# Patient Record
Sex: Female | Born: 1955 | Race: White | Hispanic: No | Marital: Married | Smoking: Former smoker
Health system: Southern US, Community
[De-identification: ages and names within clinical notes are randomized; demographics above are authoritative.]

## PROBLEM LIST (undated history)

## (undated) DIAGNOSIS — R011 Cardiac murmur, unspecified: Secondary | ICD-10-CM

## (undated) DIAGNOSIS — K219 Gastro-esophageal reflux disease without esophagitis: Secondary | ICD-10-CM

## (undated) HISTORY — PX: REDUCTION MAMMAPLASTY: SUR839

## (undated) HISTORY — DX: Gastro-esophageal reflux disease without esophagitis: K21.9

## (undated) HISTORY — DX: Cardiac murmur, unspecified: R01.1

## (undated) HISTORY — PX: ANKLE SURGERY: SHX546

## (undated) HISTORY — PX: SINUS EXPLORATION: SHX5214

---

## 1998-03-27 HISTORY — PX: MINOR HEMORRHOIDECTOMY: SHX6238

## 1998-04-23 ENCOUNTER — Other Ambulatory Visit: Admission: RE | Admit: 1998-04-23 | Discharge: 1998-04-23 | Payer: Self-pay | Admitting: General Surgery

## 1999-04-18 ENCOUNTER — Encounter: Admission: RE | Admit: 1999-04-18 | Discharge: 1999-04-18 | Payer: Self-pay | Admitting: Gynecology

## 1999-04-18 ENCOUNTER — Encounter: Payer: Self-pay | Admitting: Gynecology

## 2000-03-12 ENCOUNTER — Other Ambulatory Visit: Admission: RE | Admit: 2000-03-12 | Discharge: 2000-03-12 | Payer: Self-pay | Admitting: Gynecology

## 2000-05-01 ENCOUNTER — Encounter: Payer: Self-pay | Admitting: Gynecology

## 2000-05-01 ENCOUNTER — Encounter: Admission: RE | Admit: 2000-05-01 | Discharge: 2000-05-01 | Payer: Self-pay | Admitting: Gynecology

## 2001-03-08 ENCOUNTER — Ambulatory Visit (HOSPITAL_BASED_OUTPATIENT_CLINIC_OR_DEPARTMENT_OTHER): Admission: RE | Admit: 2001-03-08 | Discharge: 2001-03-09 | Payer: Self-pay | Admitting: Plastic Surgery

## 2001-03-08 ENCOUNTER — Encounter (INDEPENDENT_AMBULATORY_CARE_PROVIDER_SITE_OTHER): Payer: Self-pay | Admitting: *Deleted

## 2002-04-18 ENCOUNTER — Encounter: Admission: RE | Admit: 2002-04-18 | Discharge: 2002-04-18 | Payer: Self-pay | Admitting: Gynecology

## 2002-04-18 ENCOUNTER — Encounter: Payer: Self-pay | Admitting: Gynecology

## 2002-05-05 ENCOUNTER — Other Ambulatory Visit: Admission: RE | Admit: 2002-05-05 | Discharge: 2002-05-05 | Payer: Self-pay | Admitting: Gynecology

## 2003-04-24 ENCOUNTER — Encounter: Admission: RE | Admit: 2003-04-24 | Discharge: 2003-04-24 | Payer: Self-pay | Admitting: Gynecology

## 2003-05-11 ENCOUNTER — Other Ambulatory Visit: Admission: RE | Admit: 2003-05-11 | Discharge: 2003-05-11 | Payer: Self-pay | Admitting: Gynecology

## 2004-04-25 ENCOUNTER — Encounter: Admission: RE | Admit: 2004-04-25 | Discharge: 2004-04-25 | Payer: Self-pay | Admitting: Gynecology

## 2004-05-23 ENCOUNTER — Other Ambulatory Visit: Admission: RE | Admit: 2004-05-23 | Discharge: 2004-05-23 | Payer: Self-pay | Admitting: Gynecology

## 2005-05-18 ENCOUNTER — Encounter: Admission: RE | Admit: 2005-05-18 | Discharge: 2005-05-18 | Payer: Self-pay | Admitting: Gynecology

## 2005-06-19 ENCOUNTER — Other Ambulatory Visit: Admission: RE | Admit: 2005-06-19 | Discharge: 2005-06-19 | Payer: Self-pay | Admitting: Gynecology

## 2006-05-22 ENCOUNTER — Encounter: Admission: RE | Admit: 2006-05-22 | Discharge: 2006-05-22 | Payer: Self-pay | Admitting: Gynecology

## 2006-06-07 ENCOUNTER — Encounter: Admission: RE | Admit: 2006-06-07 | Discharge: 2006-06-07 | Payer: Self-pay | Admitting: Gynecology

## 2006-07-02 ENCOUNTER — Other Ambulatory Visit: Admission: RE | Admit: 2006-07-02 | Discharge: 2006-07-02 | Payer: Self-pay | Admitting: Gynecology

## 2007-06-20 ENCOUNTER — Encounter: Admission: RE | Admit: 2007-06-20 | Discharge: 2007-06-20 | Payer: Self-pay | Admitting: Gynecology

## 2008-07-01 ENCOUNTER — Encounter: Admission: RE | Admit: 2008-07-01 | Discharge: 2008-07-01 | Payer: Self-pay | Admitting: Gynecology

## 2009-07-06 ENCOUNTER — Encounter: Admission: RE | Admit: 2009-07-06 | Discharge: 2009-07-06 | Payer: Self-pay | Admitting: Gynecology

## 2010-03-27 HISTORY — PX: BREAST REDUCTION SURGERY: SHX8

## 2010-06-01 ENCOUNTER — Other Ambulatory Visit: Payer: Self-pay | Admitting: Gynecology

## 2010-06-01 DIAGNOSIS — Z1231 Encounter for screening mammogram for malignant neoplasm of breast: Secondary | ICD-10-CM

## 2010-07-19 ENCOUNTER — Ambulatory Visit
Admission: RE | Admit: 2010-07-19 | Discharge: 2010-07-19 | Disposition: A | Discharge status: Home / Self Care | Payer: PRIVATE HEALTH INSURANCE | Source: Ambulatory Visit | Attending: Gynecology | Admitting: Gynecology

## 2010-07-19 DIAGNOSIS — Z1231 Encounter for screening mammogram for malignant neoplasm of breast: Secondary | ICD-10-CM

## 2010-08-12 NOTE — Op Note (Signed)
[facility]  Patient:    Tracy Cline, RULAND Visit Number: [id number] MRN: [id number]          Service Type: DSU Location: [facility] Attending Physician:  Eloise Levels Dictated by:   Mary A. Contogiannis, M.D. Proc. Date: 03/08/01 Admit Date:  03/08/2001   CC:         Gretta Cool, M.D.   Operative Report  PREOPERATIVE DIAGNOSIS:  Bilateral macromastia.  POSTOPERATIVE DIAGNOSIS:  Bilateral macromastia.  PROCEDURE:  Bilateral reduction mammoplasties.  SURGEON:  Mary A. Contogiannis, M.D.  ASSISTANT:  Stanford Scotland, C.F.A.  ANESTHESIA:  General endotracheal anesthesia.  ANESTHESIOLOGIST:  Maren Beach, M.D.  ESTIMATED BLOOD LOSS:  200 cc.  FLUID REPLACEMENT:  3600 cc crystalloid.  URINE OUTPUT:  300 cc.  COMPLICATIONS:  None.  BREAST TISSUE REMOVED:  Right breast 502 grams, left breast 564 grams.  JUSTIFICATION FOR OVERNIGHT STAY:  Progressive pain control along with ambulation and monitoring of the nipples and breast flaps.  INDICATIONS:  The patient is a 55 year old Caucasian female who was referred by Gretta Cool, M.D. for evaluation of her large breasts.  She has upper backaches, neckaches, headaches, shoulder marks, and pain.  She now presents to undergo bilateral reduction mammoplasties.  DESCRIPTION OF PROCEDURE:  The patient was marked in the preoperative holding area in a pattern of Wise.  She was then taken back to the OR, placed on the table in the supine position.  After adequate general endotracheal anesthesia was obtained, the patients chest was prepped with Betadine and alcohol and draped in a sterile fashion.  The base of the breasts were then injected with 1% lidocaine with epinephrine.  After adequate hemostasis and anesthesia had taken effect, the procedure was begun.  First the right breast reduction was performed.  The nipple was marked with the 42 mm nipple marker.  The skin was then  deepithelialized around this down to the inframammary crease in the inferior pedicle pattern.  Next, the medial, superior, and lateral skin flaps were then elevated down to the chest wall and slightly off to allow shaping of the breasts.  The excess fat and glandular tissue were then removed from the inferior pedicle.  The nipple was then examined and found to be pink and viable.  Meticulous hemostasis was obtained. The wound was irrigated with saline.  The inferior pedicle was then centralized using 3-0 Prolene suture.  A #10 JP flat fully-fluted drain was then placed into the wound.  The skin flaps were brought together at the inverted T junction with a 2-0 Prolene suture.  The skin incisions were stapled for temporary closure.  Attention was then turned to the left breast.  The nipple was marked to the 42 mm nipple marker.  This was then incised and the skin around it deepithelialized down to the inferior mammary crease in the inferior pedicle pattern.  Next, the medial, superior, and lateral skin flaps were elevated down to the chest wall and slightly off for shaping of the breasts.  The excess fat and glandular tissue were then removed from the inferior pedicle. The nipple then examined and found to be pink and viable after this dissection. The wound was then irrigated with saline irrigation.  Meticulous hemostasis was obtained with the Bovie electrocautery.  The inferior pedicle was centralized using 3-0 Prolene suture.  A #10 JP flat fully-fluted drain was then placed into the wound.  The skin flaps were brought together in the inverted T junction  with a 2-0 Prolene suture.  The skin incisions were then stapled for temporary closure.  The breasts were then compared and found to have good shape and symmetry.  The staples were removed from each breast and the incisions closed using 3-0 Monocryl interrupted dermal sutures followed by a 4-0 Monocryl running intracuticular stitch on the  skin.  The patient was then placed in the upright position.  The location of the nipples were marked on each breast with a 38 mm nipple marker.  She was then placed back in a recumbant position.  The area for the future nipple areolar complex was then incised on the right breast.  This tissue was removed full thickness with the Bovie electrocautery. The nipple was then brought out into this aperture and sewn in place using 4-0 Monocryl interrupted dermal sutures followed by 5-0 Monocryl running intracuticular stitch on the skin.  The nipple was pink and viable.  Attention was then turned to the left breast.  The area for the future nipple areolar complex on the left breast was incised and all of the tissue excised full thickness with the Bovie electrocautery.  The nipple was then brought out into this aperture and sewn in place using 4-0 Monocryl interrupted dermal sutures followed by a 5-0 Monocryl running intracuticular stitch on the skin.  The drains were then secured in place using 3-0 nylon suture.  The incisions were dressed with Benzoin and Steri-Strips and the nipples additionally with Bacitracin ointment and Adaptic.  4 x 4s were placed over this and the patient was placed into light support bra.  There were no complications.  The patient tolerated the procedure well.  She was then extubated and taken to the recovery room in stable condition.  The final sponge, needle, and instrument counts were correct at the end of the case.  She will remain overnight in the [facility] for progressive pain control along with ambulation and monitoring of the nipples and breast flaps.  Discharge is planned for the morning. Dictated by:   Mary A. Contogiannis, M.D. Attending Physician:  Eloise Levels DD:  03/08/01 TD:  03/08/01 Job: [id number]

## 2011-06-29 ENCOUNTER — Other Ambulatory Visit: Payer: Self-pay | Admitting: Gynecology

## 2011-06-29 DIAGNOSIS — Z1231 Encounter for screening mammogram for malignant neoplasm of breast: Secondary | ICD-10-CM

## 2011-07-20 ENCOUNTER — Ambulatory Visit
Admission: RE | Admit: 2011-07-20 | Discharge: 2011-07-20 | Disposition: A | Discharge status: Home / Self Care | Payer: PRIVATE HEALTH INSURANCE | Source: Ambulatory Visit | Attending: Gynecology | Admitting: Gynecology

## 2011-07-20 DIAGNOSIS — Z1231 Encounter for screening mammogram for malignant neoplasm of breast: Secondary | ICD-10-CM

## 2012-07-25 ENCOUNTER — Other Ambulatory Visit: Payer: Self-pay

## 2012-07-25 DIAGNOSIS — Z1231 Encounter for screening mammogram for malignant neoplasm of breast: Secondary | ICD-10-CM

## 2012-09-02 ENCOUNTER — Ambulatory Visit
Admission: RE | Admit: 2012-09-02 | Discharge: 2012-09-02 | Disposition: A | Discharge status: Home / Self Care | Payer: PRIVATE HEALTH INSURANCE | Source: Ambulatory Visit

## 2012-09-02 DIAGNOSIS — Z1231 Encounter for screening mammogram for malignant neoplasm of breast: Secondary | ICD-10-CM

## 2012-09-23 ENCOUNTER — Other Ambulatory Visit: Payer: Self-pay | Admitting: Gynecology

## 2012-09-23 DIAGNOSIS — E2839 Other primary ovarian failure: Secondary | ICD-10-CM

## 2012-12-23 ENCOUNTER — Ambulatory Visit
Admission: RE | Admit: 2012-12-23 | Discharge: 2012-12-23 | Disposition: A | Discharge status: Home / Self Care | Payer: PRIVATE HEALTH INSURANCE | Source: Ambulatory Visit | Attending: Gynecology | Admitting: Gynecology

## 2012-12-23 DIAGNOSIS — E2839 Other primary ovarian failure: Secondary | ICD-10-CM

## 2013-04-25 ENCOUNTER — Ambulatory Visit (INDEPENDENT_AMBULATORY_CARE_PROVIDER_SITE_OTHER): Payer: PRIVATE HEALTH INSURANCE | Admitting: Surgery

## 2013-04-25 ENCOUNTER — Encounter (INDEPENDENT_AMBULATORY_CARE_PROVIDER_SITE_OTHER): Payer: Self-pay | Admitting: Surgery

## 2013-04-25 VITALS — BP 128/81 | HR 72 | Temp 98.1°F | Resp 18 | Ht 67.0 in | Wt 159.4 lb

## 2013-04-25 DIAGNOSIS — K648 Other hemorrhoids: Secondary | ICD-10-CM

## 2013-04-25 DIAGNOSIS — K644 Residual hemorrhoidal skin tags: Secondary | ICD-10-CM

## 2013-04-25 MED ORDER — OXYCODONE HCL 5 MG PO TABS: 5.0000 mg | ORAL_TABLET | Freq: Four times a day (QID) | ORAL | Status: DC | PRN

## 2013-04-25 MED ORDER — NAPROXEN 500 MG PO TABS
500.0000 mg | ORAL_TABLET | Freq: Two times a day (BID) | ORAL | Status: DC
Start: 2013-04-25 — End: 2013-04-25

## 2013-04-25 MED ORDER — NAPROXEN 500 MG PO TABS: 500.0000 mg | ORAL_TABLET | Freq: Two times a day (BID) | ORAL | Status: DC

## 2013-04-25 NOTE — Progress Notes (Signed)
Subjective:     Patient ID: Tracy Cline, female   DOB: [birth year], 58 y.o.   MRN: [id number]  HPI  Note: This dictation was prepared with Dragon/digital dictation along with [facility] technology. Any transcriptional errors that result from this process are unintentional.       Tracy Cline  December 16, 1955 [id number]  Patient Care Team: Lise Auer as PCP - General (Family Medicine) Laurell Roof, MD as Consulting Physician (Gastroenterology)  This patient is a 58 y.o.female who presents today for surgical evaluation at the request self.   Reason for visit: Hemorrhoids  Pleasant woman with history of constipation.  She usually controls with a fiber bowel regimen.  Has had some hemorrhoid issues in the past.  Had an anal pain removed in 2000 by Dr. Earlene Plater.  He has since retired.  Had some bleeding in 2009.  No definite abnormality seen by Dr. Abbey Chatters.  He recommended colonoscopy.  Eventually was done that showed no major problems.  Had followup colonoscopy [id number] that confirmed no new lesions.  However, internal hemorrhoid prolapsing out noted.  He notes that she has been having something pop out.  It is painful when it does.  Some bleeding and irritation.  She was to be considered for having it removed.  Therefore she comes in today.  No personal nor family history of GI/colon cancer, inflammatory bowel disease, irritable bowel syndrome, allergy such as Celiac Sprue, dietary/dairy problems, colitis, ulcers nor gastritis.  No recent sick contacts/gastroenteritis.  No travel outside the country.  No changes in diet.  No dysphagia to solids or liquids.  No significant heartburn or reflux.  No hematochezia, hematemesis, coffee ground emesis.  No evidence of prior gastric/peptic ulceration.    There are no active problems to display for this patient.   Past Medical History  Diagnosis Date  . GERD (gastroesophageal reflux disease)   . Heart murmur     Past Surgical History    Procedure Laterality Date  . Breast reduction surgery  2012    Mary A. Contogiannis, M.D  . Ankle surgery    . Sinus exploration      History   Social History  . Marital Status: Married    Spouse Name: N/A    Number of Children: N/A  . Years of Education: N/A   Occupational History  . Not on file.   Social History Main Topics  . Smoking status: Former Games developer  . Smokeless tobacco: Not on file  . Alcohol Use: Yes     Comment: wine  . Drug Use: No  . Sexual Activity: Not on file   Other Topics Concern  . Not on file   Social History Narrative  . No narrative on file    History reviewed. No pertinent family history.  Current Outpatient Prescriptions  Medication Sig Dispense Refill  . aspirin 81 MG tablet Take 81 mg by mouth daily.      . beta carotene w/minerals (OCUVITE) tablet Take 1 tablet by mouth daily.      . Cholecalciferol (VITAMIN D3) 2000 UNITS TABS Take by mouth.      . cyclobenzaprine (FLEXERIL) 5 MG tablet Take 5 mg by mouth 3 (three) times daily as needed for muscle spasms.      Marland Kitchen estradiol (VIVELLE-DOT) 0.05 MG/24HR patch Place 1 patch onto the skin once a week.      . omega-3 acid ethyl esters (LOVAZA) 1 G capsule Take by mouth 2 (two) times daily.      Marland Kitchen  rizatriptan (MAXALT) 10 MG tablet Take 10 mg by mouth as needed for migraine. May repeat in 2 hours if needed      . valACYclovir (VALTREX) 1000 MG tablet Take 1,000 mg by mouth 2 (two) times daily.       No current facility-administered medications for this visit.     Allergies  Allergen Reactions  . Ampicillin Hives  . Penicillins Swelling  . Tetracyclines & Related Hives    BP 128/81  Pulse 72  Temp(Src) 98.1 F (36.7 C) (Temporal)  Resp 18  Ht 5\' 7"  (1.702 m)  Wt 159 lb 6.4 oz (72.303 kg)  BMI 24.96 kg/m2  LMP 07/01/2012  No results found.   Review of Systems  Constitutional: Negative for fever, chills, diaphoresis, appetite change and fatigue.  HENT: Negative for ear  discharge, ear pain, sore throat and trouble swallowing.   Eyes: Negative for photophobia, discharge and visual disturbance.  Respiratory: Negative for cough, choking, chest tightness and shortness of breath.   Cardiovascular: Negative for chest pain and palpitations.  Gastrointestinal: Positive for constipation and anal bleeding. Negative for nausea, vomiting, abdominal pain, diarrhea and rectal pain.  Endocrine: Negative for cold intolerance and heat intolerance.  Genitourinary: Negative for dysuria, frequency and difficulty urinating.  Musculoskeletal: Negative for gait problem, myalgias and neck pain.  Skin: Negative for color change, pallor and rash.  Allergic/Immunologic: Negative for environmental allergies, food allergies and immunocompromised state.  Neurological: Positive for headaches. Negative for dizziness, speech difficulty, weakness and numbness.  Hematological: Negative for adenopathy. Bruises/bleeds easily.  Psychiatric/Behavioral: Negative for confusion and agitation. The patient is not nervous/anxious.        Objective:   Physical Exam  Constitutional: She is oriented to person, place, and time. She appears well-developed and well-nourished. No distress.  HENT:  Head: Normocephalic.  Mouth/Throat: Oropharynx is clear and moist. No oropharyngeal exudate.  Eyes: Conjunctivae and EOM are normal. Pupils are equal, round, and reactive to light. No scleral icterus.  Neck: Normal range of motion. Neck supple. No tracheal deviation present.  Cardiovascular: Normal rate, regular rhythm and intact distal pulses.   Pulmonary/Chest: Effort normal and breath sounds normal. No stridor. No respiratory distress. She exhibits no tenderness.  Abdominal: Soft. She exhibits no distension and no mass. There is no tenderness. Hernia confirmed negative in the right inguinal area and confirmed negative in the left inguinal area.  Genitourinary:    No vaginal discharge found.  Exam done with  assistance of female Medical Assistant in the room.  Perianal skin clean with good hygiene.  No pruritis.  No pilonidal disease.  No fissure.  No abscess/fistula.  Normal sphincter tone.  Tolerates digital and anoscopic rectal exam  External hemorrhoid skin tags L sided 5-577mm.   Moderate Left posterior internal hemorrhoid pedunculated easily prolapses out.   No other rectal masses.  Other hemorrhoidal piles mildly enlarged  Musculoskeletal: Normal range of motion. She exhibits no tenderness.       Right elbow: She exhibits normal range of motion.       Left elbow: She exhibits normal range of motion.       Right wrist: She exhibits normal range of motion.       Left wrist: She exhibits normal range of motion.       Right hand: Normal strength noted.       Left hand: Normal strength noted.  Lymphadenopathy:       Head (right side): No posterior auricular adenopathy present.  Head (left side): No posterior auricular adenopathy present.    She has no cervical adenopathy.    She has no axillary adenopathy.       Right: No inguinal adenopathy present.       Left: No inguinal adenopathy present.  Neurological: She is alert and oriented to person, place, and time. No cranial nerve deficit. She exhibits normal muscle tone. Coordination normal.  Skin: Skin is warm and dry. No rash noted. She is not diaphoretic. No erythema.  Psychiatric: She has a normal mood and affect. Her behavior is normal. Judgment and thought content normal.       Assessment:     Prolapsing internal hemorrhoid rather pedunculated with some annoying external hemorrhoid tags.  Wishing removal in office.     Plan:     I discussed options with her.  These are 2 distal and sensitive to just band.  Because they are not I. Large-volume, did offer to try and excise the office under local.  The other option is to do this under anesthesia.  She wished to proceed with Removal in the office.  Proceeded:  The anatomy & physiology  of the anorectal region was discussed.  The pathophysiology of anorectal masses and differential diagnosis was discussed.  Natural history progression was discussed.   I stressed the importance of a bowel regimen to have daily soft bowel movements to minimize progression of disease.     The patient's symptoms are not adequately controlled.  Non-operative treatment has not healed the abscess.  Therefore, I recommended removal.  Technique, risks, benefits, alternatives discussed.  The patient expressed understanding & wished to proceed.  The patient was positioned lateral decubitus.  I placed a field block with local anaesthetic.  I was able to get the left internal prolapsing hemorrhoid found.  I placed a 3-0 chromic stitch proximal in the rectum.  I excised the internal hemorrhoid and extended that to an external hemorrhoid.  I closed the wound with a 3-0 chromic stitch in a running fashion, leaving a few millimeters open distally to drain.  I excised a left anterior external hemorrhoid tag and closed that with a chromic stitch as well.  The patient tolerated the procedure.   Educational handouts further explaining the pathology, treatment options, and bowel regimen were given.  We will have the patient return to clinic for close follow up to make sure the infection heals

## 2013-04-25 NOTE — Patient Instructions (Signed)
ANORECTAL SURGERY: POST OP INSTRUCTIONS  1. Take your usually prescribed home medications unless otherwise directed. 2. DIET: Follow a light bland diet the first 24 hours after arrival home, such as soup, liquids, crackers, etc.  Be sure to include lots of fluids daily.  Avoid fast food or heavy meals as your are more likely to get nauseated.  Eat a low fat the next few days after surgery.   3. PAIN CONTROL: a. Pain is best controlled by a usual combination of three different methods TOGETHER: i. Ice/Heat ii. Over the counter pain medication iii. Prescription pain medication b. Most patients will experience some swelling and discomfort in the anus/rectal area. and incisions.  Ice packs or heat (30-60 minutes up to 6 times a day) will help. Use ice for the first few days to help decrease swelling and bruising, then switch to heat such as warm towels, sitz baths, warm baths, etc to help relax tight/sore spots and speed recovery.  Some people prefer to use ice alone, heat alone, alternating between ice & heat.  Experiment to what works for you.  Swelling and bruising can take several weeks to resolve.   c. It is helpful to take an over-the-counter pain medication regularly for the first few weeks.  Choose one of the following that works best for you: i. Naproxen (Aleve, etc)  Two 220mg tabs twice a day ii. Ibuprofen (Advil, etc) Three 200mg tabs four times a day (every meal & bedtime) iii. Acetaminophen (Tylenol, etc) 500-650mg four times a day (every meal & bedtime) d. A  prescription for pain medication (such as oxycodone, hydrocodone, etc) should be given to you upon discharge.  Take your pain medication as prescribed.  i. If you are having problems/concerns with the prescription medicine (does not control pain, nausea, vomiting, rash, itching, etc), please call us [phone] to see if we need to switch you to a different pain medicine that will work better for you and/or control your side effect  better. ii. If you need a refill on your pain medication, please contact your pharmacy.  They will contact our office to request authorization. Prescriptions will not be filled after 5 pm or on week-ends.  Use a Sitz Bath 4-8 times a day for relief A sitz bath is a warm water bath taken in the sitting position that covers only the hips and buttocks. It may be used for either healing or hygiene purposes. Sitz baths are also used to relieve pain, itching, or muscle spasms. The water may contain medicine. Moist heat will help you heal and relax.  HOME CARE INSTRUCTIONS  Take 3 to 4 sitz baths a day. 1. Fill the bathtub half full with warm water. 2. Sit in the water and open the drain a little. 3. Turn on the warm water to keep the tub half full. Keep the water running constantly. 4. Soak in the water for 15 to 20 minutes. 5. After the sitz bath, pat the affected area dry first. SEEK MEDICAL CARE IF:  You get worse instead of better. Stop the sitz baths if you get worse.   4. KEEP YOUR BOWELS REGULAR a. The goal is one bowel movement a day b. Avoid getting constipated.  Between the surgery and the pain medications, it is common to experience some constipation.  Increasing fluid intake and taking a fiber supplement (such as Metamucil, Citrucel, FiberCon, MiraLax, etc) 1-2 times a day regularly will usually help prevent this problem from occurring.  A mild   laxative (prune juice, Milk of Magnesia, MiraLax, etc) should be taken according to package directions if there are no bowel movements after 48 hours. c. Watch out for diarrhea.  If you have many loose bowel movements, simplify your diet to bland foods & liquids for a few days.  Stop any stool softeners and decrease your fiber supplement.  Switching to mild anti-diarrheal medications (Kayopectate, Pepto Bismol) can help.  If this worsens or does not improve, please call us.  5. Wound Care a. Remove your bandages the day after surgery.  Unless  discharge instructions indicate otherwise, leave your bandage dry and in place overnight.  Remove the bandage during your first bowel movement.   b. Allow the wound packing to fall out over the next few days.  You can trim exposed gauze / ribbon as it falls out.  You do not need to repack the wound unless instructed otherwise.  Wear an absorbent pad or soft cotton gauze in your underwear as needed to catch any drainage and help keep the area  c. Keep the area clean and dry.  Bathe / shower every day.  Keep the area clean by showering / bathing over the incision / wound.   It is okay to soak an open wound to help wash it.  Wet wipes or showers / gentle washing after bowel movements is often less traumatic than regular toilet paper. d. You may have some styrofoam-like soft packing in the rectum which will come out with the first bowel movement.  e. You will often notice bleeding with bowel movements.  This should slow down by the end of the first week of surgery f. Expect some drainage.  This should slow down, too, by the end of the first week of surgery.  Wear an absorbent pad or soft cotton gauze in your underwear until the drainage stops. 6. ACTIVITIES as tolerated:   a. You may resume regular (light) daily activities beginning the next day-such as daily self-care, walking, climbing stairs-gradually increasing activities as tolerated.  If you can walk 30 minutes without difficulty, it is safe to try more intense activity such as jogging, treadmill, bicycling, low-impact aerobics, swimming, etc. b. Save the most intensive and strenuous activity for last such as sit-ups, heavy lifting, contact sports, etc  Refrain from any heavy lifting or straining until you are off narcotics for pain control.   c. DO NOT PUSH THROUGH PAIN.  Let pain be your guide: If it hurts to do something, don't do it.  Pain is your body warning you to avoid that activity for another week until the pain goes down. d. You may drive when  you are no longer taking prescription pain medication, you can comfortably sit for long periods of time, and you can safely maneuver your car and apply brakes. e. You may have sexual intercourse when it is comfortable.  7. FOLLOW UP in our office a. Please call [facility] at [phone] to set up an appointment to see your surgeon in the office for a follow-up appointment approximately 2 weeks after your surgery. b. Make sure that you call for this appointment the day you arrive home to insure a convenient appointment time. 10. IF YOU HAVE DISABILITY OR FAMILY LEAVE FORMS, BRING THEM TO THE OFFICE FOR PROCESSING.  DO NOT GIVE THEM TO YOUR DOCTOR.        WHEN TO CALL US [phone]: 1. Poor pain control 2. Reactions / problems with new medications (rash/itching, nausea, etc)    3. Fever over 101.5 F (38.5 C) 4. Inability to urinate 5. Nausea and/or vomiting 6. Worsening swelling or bruising 7. Continued bleeding from incision. 8. Increased pain, redness, or drainage from the incision  The clinic staff is available to answer your questions during regular business hours (8:30am-5pm).  Please don't hesitate to call and ask to speak to one of our nurses for clinical concerns.   A surgeon from [facility] is always on call at the hospitals   If you have a medical emergency, go to the nearest emergency room or call 911.    [facility] [address], [address], [place], [place]  [address] ? MAIN: [phone] ? TOLL FREE: [phone] ? FAX [phone] [email or web address]  HEMORRHOIDS  The rectum is the last foot of your colon, and it naturally stretches to hold stool.  Hemorrhoidal piles are natural clusters of blood vessels that help the rectum and anal canal stretch to hold stool and allow bowel movements to eliminate feces.   Hemorrhoids are abnormally swollen blood vessels in the rectum.  Too much pressure in the rectum causes  hemorrhoids by forcing blood to stretch and bulge the walls of the veins, sometimes even rupturing them.  Hemorrhoids can become like varicose veins you might see on a person's legs.  Most people will develop a flare of hemorrhoids in their lifetime.  When bulging hemorrhoidal veins are irritated, they can swell, burn, itch, cause pain, and bleed.  Most flares will calm down gradually own within a few weeks.  However, once hemorrhoids are created, they are difficult to get rid of completely and tend to flare more easily than the first flare.   Fortunately, good habits and simple medical treatment usually control hemorrhoids well, and surgery is needed only in severe cases. Types of Hemorrhoids:  Internal hemorrhoids usually don't initially hurt or itch; they are deep inside the rectum and usually have no sensation. If they begin to push out (prolapse), pain and burning can occur.  However, internal hemorrhoids can bleed.  Anal bleeding should not be ignored since bleeding could come from a dangerous source like colorectal cancer, so persistent rectal bleeding should be investigated by a doctor, sometimes with a colonoscopy.  External hemorrhoids cause most of the symptoms - pain, burning, and itching. Nonirritated hemorrhoids can look like small skin tags coming out of the anus.   Thrombosed hemorrhoids can form when a hemorrhoid blood vessel bursts and causes the hemorrhoid to suddenly swell.  A purple blood clot can form in it and become an excruciatingly painful lump at the anus. Because of these unpleasant symptoms, immediate incision and drainage by a surgeon at an office visit can provide much relief of the pain.    PREVENTION Avoiding the most frequent causes listed below will prevent most cases of hemorrhoids: Constipation Hard stools Diarrhea  Constant sitting  Straining with bowel movements Sitting on the toilet for a long time  Severe coughing  episodes Pregnancy / Childbirth  Heavy  Lifting  Sometimes avoiding the above triggers is difficult:  How can you avoid sitting all day if you have a seated job? Also, we try to avoid coughing and diarrhea, but sometimes it's beyond your control.  Still, there are some practical hints to help: Keep the anal and genital area clean.  Moistened tissues such as flushable wet wipes are less irritating than toilet paper.  Using irrigating showers or bottle irrigation washing gently cleans this sensitive area.     Avoid dry toilet paper when cleaning after bowel movements.  . Keep the anal and genital area dry.  Lightly pat the rectal area dry.  Avoid rubbing.  Talcum or baby powders can help GET YOUR STOOLS SOFT.   This is the most important way to prevent irritated hemorrhoids.  Hard stools are like sandpaper to the anorectal canal and will cause more problems.  The goal: ONE SOFT BOWEL MOVEMENT A DAY!  BMs from every other day to 3 times a day is a tolerable range Treat coughing, diarrhea and constipation early since irritated hemorrhoids may soon follow.  If your main job activity is seated, always stand or walk during your breaks. Make it a point to stand and walk at least 5 minutes every hour and try to shift frequently in your chair to avoid direct rectal pressure.  Always exhale as you strain or lift. Don't hold your breath.  Do not delay or try to prevent a bowel movement when the urge is present. Exercise regularly (walking or jogging 60 minutes a day) to stimulate the bowels to move. No reading or other activity while on the toilet. If bowel movements take longer than 5 minutes, you are too constipated. AVOID CONSTIPATION Drink plenty of liquids (1 1/2 to 2 quarts of water and other fluids a day unless fluid restricted for another medical condition). Liquids that contain caffeine (coffee a, tea, soft drinks) can be dehydrating and should be avoided until constipation is controlled. Consider minimizing milk, as dairy products may be  constipating. Eat plenty of fiber (30g a day ideal, more if needed).  Fiber is the undigested part of plant food that passes into the colon, acting as "natures broom" to encourage bowel motility and movement.  Fiber can absorb and hold large amounts of water. This results in a larger, bulkier stool, which is soft and easier to pass.  Eating foods high in fiber - 12 servings - such as  Vegetables: Root (potatoes, carrots, turnips), Leafy green (lettuce, salad greens, celery, spinach), High residue (cabbage, broccoli, etc.) Fruit: Fresh, Dried (prunes, apricots, cherries), Stewed (applesauce)  Whole grain breads, pasta, whole wheat Bran cereals, muffins, etc. Consider adding supplemental bulking fiber which retains large volumes of water: Psyllium ground seeds (native plant from [place])--available as Metamucil, Konsyl, Effersyllium, Per Diem Fiber, or the less expensive generic forms.  Citrucel  (methylcellulose wood fiber) . FiberCon (Polycarbophil) Polyethylene Glycol - and "artificial" fiber commonly called Miralax or Glycolax.  It is helpful for people with gassy or bloated feelings with regular fiber Flax Seed - a less gassy natural fiber  Laxatives can be useful for a short period if constipation is severe Osmotics (Milk of Magnesia, Fleets Phospho-Soda, Magnesium Citrate)  Stimulants (Senokot,   Castor Oil,  Dulcolax, Ex-Lax)    Laxatives are not a good long-term solution as it can stress the bowels and cause too much mineral loss and dehydration.   Avoid taking laxatives for more than 7 days in a row.  AVOID DIARRHEA Switch to liquids and simpler foods for a few days to avoid stressing your intestines further. Avoid dairy products (especially milk & ice cream) for a short time.  The intestines often can lose the ability to digest lactose when stressed. Avoid foods that cause gassiness or bloating.  Typical foods include beans and other legumes, cabbage, broccoli, and dairy foods.   Every person has some sensitivity to other foods, so listen to your body and avoid those foods that trigger problems for   you. Adding fiber (Citrucel, Metamucil, FiberCon, Flax seed, Miralax) gradually can help thicken stools by absorbing excess fluid and retrain the intestines to act more normally.  Slowly increase the dose over a few weeks.  Too much fiber too soon can backfire and cause cramping & bloating. Probiotics (such as active yogurt, Align, etc) may help repopulate the intestines and colon with normal bacteria and calm down a sensitive digestive tract.  Most studies show it to be of mild help, though, and such products can be costly. Medicines: Bismuth subsalicylate (ex. Kayopectate, Pepto Bismol) every 30 minutes for up to 6 doses can help control diarrhea.  Avoid if pregnant. Loperamide (Immodium) can slow down diarrhea.  Start with two tablets (4mg total) first and then try one tablet every 6 hours.  Avoid if you are having fevers or severe pain.  If you are not better or start feeling worse, stop all medicines and call your doctor for advice Call your doctor if you are getting worse or not better.  Sometimes further testing (cultures, endoscopy, X-ray studies, bloodwork, etc) may be needed to help diagnose and treat the cause of the diarrhea. TREATMENT OF HEMORRHOID FLARE If these preventive measures fail, you must take action right away! Hemorrhoids are one condition that can be mild in the morning and become intolerable by nightfall. Most hemorrhoidal flares take several weeks to calm down.  These suggestions can help: Warm soaks.  This helps more than any topical medication.  Use up to 8 times a day.  Usually sitz baths or sitting in a warm bathtub helps.  Sitting on moist warm towels are helpful.  Switching to ice packs/cool compresses can be helpful  Use a Sitz Bath 4-8 times a day for relief A sitz bath is a warm water bath taken in the sitting position that covers only the hips and  buttocks. It may be used for either healing or hygiene purposes. Sitz baths are also used to relieve pain, itching, or muscle spasms. The water may contain medicine. Moist heat will help you heal and relax.  HOME CARE INSTRUCTIONS  Take 3 to 4 sitz baths a day. 6. Fill the bathtub half full with warm water. 7. Sit in the water and open the drain a little. 8. Turn on the warm water to keep the tub half full. Keep the water running constantly. 9. Soak in the water for 15 to 20 minutes. 10. After the sitz bath, pat the affected area dry first. SEEK MEDICAL CARE IF:  You get worse instead of better. Stop the sitz baths if you get worse.  Normalize your bowels.  Extremes of diarrhea or constipation will make hemorrhoids worse.  One soft bowel movement a day is the goal.  Fiber can help get your bowels regular Wet wipes instead of toilet paper Pain control with a NSAID such as ibuprofen (Advil) or naproxen (Aleve) or acetaminophen (Tylenol) around the clock.  Narcotics are constipating and should be minimized if possible Topical creams contain steroids (bydrocortisone) or local anesthetic (xylocaine) can help make pain and itching more tolerable.   EVALUATION If hemorrhoids are still causing problems, you could benefit by an evaluation by a surgeon.  The surgeon will obtain a history and examine you.  If hemorrhoids are diagnosed, some therapies can be offered in the office, usually with an anoscope into the less sensitive area of the rectum: -injection of hemorrhoids (sclerotherapy) can scar the blood vessels of the swollen/enlarged hemorrhoids to help shrink them down to   a more normal size -rubber banding of the enlarged hemorrhoids to help shrink them down to a more normal size -drainage of the blood clot causing a thrombosed hemorrhoid,  to relieve the severe pain   While 90% of the time such problems from hemorrhoids can be managed without preceding to surgery, sometimes the hemorrhoids require a  operation to control the problem (uncontrolled bleeding, prolapse, pain, etc.).   This involves being placed under general anesthesia where the surgeon can confirm the diagnosis and remove, suture, or staple the hemorrhoid(s).  Your surgeon can help you treat the problem appropriately.    GETTING TO GOOD BOWEL HEALTH. Irregular bowel habits such as constipation and diarrhea can lead to many problems over time.  Having one soft bowel movement a day is the most important way to prevent further problems.  The anorectal canal is designed to handle stretching and feces to safely manage our ability to get rid of solid waste (feces, poop, stool) out of our body.  BUT, hard constipated stools can act like ripping concrete bricks and diarrhea can be a burning fire to this very sensitive area of our body, causing inflamed hemorrhoids, anal fissures, increasing risk is perirectal abscesses, abdominal pain/bloating, an making irritable bowel worse.     The goal: ONE SOFT BOWEL MOVEMENT A DAY!  To have soft, regular bowel movements:    Drink at least 8 tall glasses of water a day.     Take plenty of fiber.  Fiber is the undigested part of plant food that passes into the colon, acting s "natures broom" to encourage bowel motility and movement.  Fiber can absorb and hold large amounts of water. This results in a larger, bulkier stool, which is soft and easier to pass. Work gradually over several weeks up to 6 servings a day of fiber (25g a day even more if needed) in the form of: o Vegetables -- Root (potatoes, carrots, turnips), leafy green (lettuce, salad greens, celery, spinach), or cooked high residue (cabbage, broccoli, etc) o Fruit -- Fresh (unpeeled skin & pulp), Dried (prunes, apricots, cherries, etc ),  or stewed ( applesauce)  o Whole grain breads, pasta, etc (whole wheat)  o Bran cereals    Bulking Agents -- This type of water-retaining fiber generally is easily obtained each day by one of the following:   o Psyllium bran -- The psyllium plant is remarkable because its ground seeds can retain so much water. This product is available as Metamucil, Konsyl, Effersyllium, Per Diem Fiber, or the less expensive generic preparation in drug and health food stores. Although labeled a laxative, it really is not a laxative.  o Methylcellulose -- This is another fiber derived from wood which also retains water. It is available as Citrucel. o Polyethylene Glycol - and "artificial" fiber commonly called Miralax or Glycolax.  It is helpful for people with gassy or bloated feelings with regular fiber o Flax Seed - a less gassy fiber than psyllium   No reading or other relaxing activity while on the toilet. If bowel movements take longer than 5 minutes, you are too constipated   AVOID CONSTIPATION.  High fiber and water intake usually takes care of this.  Sometimes a laxative is needed to stimulate more frequent bowel movements, but    Laxatives are not a good long-term solution as it can wear the colon out. o Osmotics (Milk of Magnesia, Fleets phosphosoda, Magnesium citrate, MiraLax, GoLytely) are safer than  o Stimulants (Senokot, Castor Oil, Dulcolax,   Ex Lax)    o Do not take laxatives for more than 7days in a row.    IF SEVERELY CONSTIPATED, try a Bowel Retraining Program: o Do not use laxatives.  o Eat a diet high in roughage, such as bran cereals and leafy vegetables.  o Drink six (6) ounces of prune or apricot juice each morning.  o Eat two (2) large servings of stewed fruit each day.  o Take one (1) heaping tablespoon of a psyllium-based bulking agent twice a day. Use sugar-free sweetener when possible to avoid excessive calories.  o Eat a normal breakfast.  o Set aside 15 minutes after breakfast to sit on the toilet, but do not strain to have a bowel movement.  o If you do not have a bowel movement by the third day, use an enema and repeat the above steps.    Controlling diarrhea o Switch to liquids and  simpler foods for a few days to avoid stressing your intestines further. o Avoid dairy products (especially milk & ice cream) for a short time.  The intestines often can lose the ability to digest lactose when stressed. o Avoid foods that cause gassiness or bloating.  Typical foods include beans and other legumes, cabbage, broccoli, and dairy foods.  Every person has some sensitivity to other foods, so listen to our body and avoid those foods that trigger problems for you. o Adding fiber (Citrucel, Metamucil, psyllium, Miralax) gradually can help thicken stools by absorbing excess fluid and retrain the intestines to act more normally.  Slowly increase the dose over a few weeks.  Too much fiber too soon can backfire and cause cramping & bloating. o Probiotics (such as active yogurt, Align, etc) may help repopulate the intestines and colon with normal bacteria and calm down a sensitive digestive tract.  Most studies show it to be of mild help, though, and such products can be costly. o Medicines:   Bismuth subsalicylate (ex. Kayopectate, Pepto Bismol) every 30 minutes for up to 6 doses can help control diarrhea.  Avoid if pregnant.   Loperamide (Immodium) can slow down diarrhea.  Start with two tablets (4mg total) first and then try one tablet every 6 hours.  Avoid if you are having fevers or severe pain.  If you are not better or start feeling worse, stop all medicines and call your doctor for advice o Call your doctor if you are getting worse or not better.  Sometimes further testing (cultures, endoscopy, X-ray studies, bloodwork, etc) may be needed to help diagnose and treat the cause of the diarrhea. o  

## 2013-04-29 ENCOUNTER — Encounter (INDEPENDENT_AMBULATORY_CARE_PROVIDER_SITE_OTHER): Payer: Self-pay

## 2013-05-13 ENCOUNTER — Encounter (INDEPENDENT_AMBULATORY_CARE_PROVIDER_SITE_OTHER): Payer: Self-pay

## 2013-05-13 ENCOUNTER — Encounter (INDEPENDENT_AMBULATORY_CARE_PROVIDER_SITE_OTHER): Payer: PRIVATE HEALTH INSURANCE | Admitting: Surgery

## 2013-05-22 ENCOUNTER — Encounter (INDEPENDENT_AMBULATORY_CARE_PROVIDER_SITE_OTHER): Payer: PRIVATE HEALTH INSURANCE | Admitting: Surgery

## 2013-06-19 ENCOUNTER — Ambulatory Visit (INDEPENDENT_AMBULATORY_CARE_PROVIDER_SITE_OTHER): Payer: PRIVATE HEALTH INSURANCE | Admitting: Surgery

## 2013-06-19 ENCOUNTER — Encounter (INDEPENDENT_AMBULATORY_CARE_PROVIDER_SITE_OTHER): Payer: Self-pay | Admitting: Surgery

## 2013-06-19 VITALS — BP 140/82 | HR 78 | Temp 98.7°F | Resp 14 | Ht 66.5 in | Wt 161.0 lb

## 2013-06-19 DIAGNOSIS — K644 Residual hemorrhoidal skin tags: Secondary | ICD-10-CM

## 2013-06-19 DIAGNOSIS — K648 Other hemorrhoids: Secondary | ICD-10-CM

## 2013-06-19 NOTE — Progress Notes (Signed)
Subjective:     Patient ID: Tracy MikesDianne Cline, female   DOB: 1956-02-07, 58 y.o.   MRN: [id number]  HPI  Note: This dictation was prepared with Dragon/digital dictation along with [facility] technology. Any transcriptional errors that result from this process are unintentional.       Tracy Cline  1956-02-07 [id number]  Patient Care Team: Lise AuerJaber A Khan as PCP - General (Family Medicine) Laurell Roofimothy Misenheimer, MD as Consulting Physician (Gastroenterology)  Procedure (Date: 04/25/2013 ):  Excision of prolapsing internal and external hemorrhoids  This patient returns for surgical re-evaluation.  She is feeling better overall.  Some blood and discomfort with bowel movements but much improved now.  No fevers or chills.  Rare blood there - much less.  In good spirirts  Patient Active Problem List   Diagnosis Date Noted  . Internal prolapsing hemorrhoid 04/25/2013  . External hemorrhoids with complication 04/25/2013    Past Medical History  Diagnosis Date  . GERD (gastroesophageal reflux disease)   . Heart murmur     Past Surgical History  Procedure Laterality Date  . Breast reduction surgery  2012    Mary A. Contogiannis, M.D  . Ankle surgery    . Sinus exploration    . Minor hemorrhoidectomy  2000    Dr Kendrick Ranchim Davis    History   Social History  . Marital Status: Married    Spouse Name: N/A    Number of Children: N/A  . Years of Education: N/A   Occupational History  . Not on file.   Social History Main Topics  . Smoking status: Former Games developermoker  . Smokeless tobacco: Not on file  . Alcohol Use: Yes     Comment: wine  . Drug Use: No  . Sexual Activity: Not on file   Other Topics Concern  . Not on file   Social History Narrative  . No narrative on file    History reviewed. No pertinent family history.  Current Outpatient Prescriptions  Medication Sig Dispense Refill  . aspirin 81 MG tablet Take 81 mg by mouth daily.      . beta carotene w/minerals (OCUVITE)  tablet Take 1 tablet by mouth daily.      . Cholecalciferol (VITAMIN D3) 2000 UNITS TABS Take by mouth.      . cyclobenzaprine (FLEXERIL) 5 MG tablet Take 5 mg by mouth 3 (three) times daily as needed for muscle spasms.      Marland Kitchen. estradiol (VIVELLE-DOT) 0.05 MG/24HR patch Place 1 patch onto the skin once a week.      . fexofenadine-pseudoephedrine (ALLEGRA-D 24) 180-240 MG per 24 hr tablet Take 1 tablet by mouth daily.      . fluticasone (FLONASE) 50 MCG/ACT nasal spray Place into both nostrils daily.      . naproxen (NAPROSYN) 500 MG tablet Take 1 tablet (500 mg total) by mouth 2 (two) times daily with a meal.  40 tablet  1  . omega-3 acid ethyl esters (LOVAZA) 1 G capsule Take by mouth 2 (two) times daily.      . rizatriptan (MAXALT) 10 MG tablet Take 10 mg by mouth as needed for migraine. May repeat in 2 hours if needed      . valACYclovir (VALTREX) 1000 MG tablet Take 1,000 mg by mouth 2 (two) times daily.       No current facility-administered medications for this visit.     Allergies  Allergen Reactions  . Ampicillin Hives  . Penicillins Swelling  . Tetracyclines &  Related Hives    BP 140/82  Pulse 78  Temp(Src) 98.7 F (37.1 C)  Resp 14  Ht 5' 6.5" (1.689 m)  Wt 161 lb (73.029 kg)  BMI 25.60 kg/m2  LMP 07/01/2012  No results found.   Review of Systems  Constitutional: Negative for fever, chills and diaphoresis.  HENT: Negative for ear pain, sore throat and trouble swallowing.   Eyes: Negative for photophobia and visual disturbance.  Respiratory: Negative for cough and choking.   Cardiovascular: Negative for chest pain and palpitations.  Gastrointestinal: Negative for nausea, vomiting, abdominal pain, diarrhea, constipation, anal bleeding and rectal pain.  Genitourinary: Negative for dysuria, frequency and difficulty urinating.  Musculoskeletal: Negative for gait problem and myalgias.  Skin: Negative for color change, pallor and rash.  Neurological: Negative for  dizziness, speech difficulty, weakness and numbness.  Hematological: Negative for adenopathy.  Psychiatric/Behavioral: Negative for confusion and agitation. The patient is not nervous/anxious.        Objective:   Physical Exam  Constitutional: She is oriented to person, place, and time. She appears well-developed and well-nourished. No distress.  HENT:  Head: Normocephalic.  Mouth/Throat: Oropharynx is clear and moist. No oropharyngeal exudate.  Eyes: Conjunctivae and EOM are normal. Pupils are equal, round, and reactive to light. No scleral icterus.  Neck: Normal range of motion. No tracheal deviation present.  Cardiovascular: Normal rate and intact distal pulses.   Pulmonary/Chest: Effort normal. No respiratory distress. She exhibits no tenderness.  Abdominal: Soft. She exhibits no distension. There is no tenderness. Hernia confirmed negative in the right inguinal area and confirmed negative in the left inguinal area.  Incisions clean with normal healing ridges.  No hernias  Genitourinary: No vaginal discharge found.  Exam done with assistance of female Medical Assistant in the room.  Perianal skin clean with good hygiene.  No pruritis.  No pilonidal disease.  No fissure.  No abscess/fistula.   Normal sphincter tone.    Narrow anal canal wound nearly fully epithelialized  Musculoskeletal: Normal range of motion. She exhibits no tenderness.  Lymphadenopathy:       Right: No inguinal adenopathy present.       Left: No inguinal adenopathy present.  Neurological: She is alert and oriented to person, place, and time. No cranial nerve deficit. She exhibits normal muscle tone. Coordination normal.  Skin: Skin is warm and dry. No rash noted. She is not diaphoretic.  Psychiatric: She has a normal mood and affect. Her behavior is normal.       Assessment:     Status post excision of internal prolapsing and external hemorrhoid, recovering well     Plan:     Increase activity as tolerated  to regular activity.  Low impact exercise such as walking an hour a day at least ideal.  Do not push through pain.  Diet as tolerated.  Low fat high fiber diet ideal.  Bowel regimen with 30 g fiber a day and fiber supplement as needed to avoid problems.  Return to clinic as needed.   Instructions discussed.  Followup with primary care physician for other health issues as would normally be done.  Consider screening for malignancies (breast, prostate, colon, melanoma, etc) as appropriate.  Questions answered.  The patient expressed understanding and appreciation

## 2013-06-19 NOTE — Patient Instructions (Signed)
ANORECTAL SURGERY: POST OP INSTRUCTIONS  1. Take your usually prescribed home medications unless otherwise directed. 2. DIET: Follow a light bland diet the first 24 hours after arrival home, such as soup, liquids, crackers, etc.  Be sure to include lots of fluids daily.  Avoid fast food or heavy meals as your are more likely to get nauseated.  Eat a low fat the next few days after surgery.   3. PAIN CONTROL: a. Pain is best controlled by a usual combination of three different methods TOGETHER: i. Ice/Heat ii. Over the counter pain medication iii. Prescription pain medication b. Most patients will experience some swelling and discomfort in the anus/rectal area. and incisions.  Ice packs or heat (30-60 minutes up to 6 times a day) will help. Use ice for the first few days to help decrease swelling and bruising, then switch to heat such as warm towels, sitz baths, warm baths, etc to help relax tight/sore spots and speed recovery.  Some people prefer to use ice alone, heat alone, alternating between ice & heat.  Experiment to what works for you.  Swelling and bruising can take several weeks to resolve.   c. It is helpful to take an over-the-counter pain medication regularly for the first few weeks.  Choose one of the following that works best for you: i. Naproxen (Aleve, etc)  Two 220mg tabs twice a day ii. Ibuprofen (Advil, etc) Three 200mg tabs four times a day (every meal & bedtime) iii. Acetaminophen (Tylenol, etc) 500-650mg four times a day (every meal & bedtime) d. A  prescription for pain medication (such as oxycodone, hydrocodone, etc) should be given to you upon discharge.  Take your pain medication as prescribed.  i. If you are having problems/concerns with the prescription medicine (does not control pain, nausea, vomiting, rash, itching, etc), please call us [phone] to see if we need to switch you to a different pain medicine that will work better for you and/or control your side effect  better. ii. If you need a refill on your pain medication, please contact your pharmacy.  They will contact our office to request authorization. Prescriptions will not be filled after 5 pm or on week-ends.  Use a Sitz Bath 4-8 times a day for relief A sitz bath is a warm water bath taken in the sitting position that covers only the hips and buttocks. It may be used for either healing or hygiene purposes. Sitz baths are also used to relieve pain, itching, or muscle spasms. The water may contain medicine. Moist heat will help you heal and relax.  HOME CARE INSTRUCTIONS  Take 3 to 4 sitz baths a day. 1. Fill the bathtub half full with warm water. 2. Sit in the water and open the drain a little. 3. Turn on the warm water to keep the tub half full. Keep the water running constantly. 4. Soak in the water for 15 to 20 minutes. 5. After the sitz bath, pat the affected area dry first. SEEK MEDICAL CARE IF:  You get worse instead of better. Stop the sitz baths if you get worse.   4. KEEP YOUR BOWELS REGULAR a. The goal is one bowel movement a day b. Avoid getting constipated.  Between the surgery and the pain medications, it is common to experience some constipation.  Increasing fluid intake and taking a fiber supplement (such as Metamucil, Citrucel, FiberCon, MiraLax, etc) 1-2 times a day regularly will usually help prevent this problem from occurring.  A mild   laxative (prune juice, Milk of Magnesia, MiraLax, etc) should be taken according to package directions if there are no bowel movements after 48 hours. c. Watch out for diarrhea.  If you have many loose bowel movements, simplify your diet to bland foods & liquids for a few days.  Stop any stool softeners and decrease your fiber supplement.  Switching to mild anti-diarrheal medications (Kayopectate, Pepto Bismol) can help.  If this worsens or does not improve, please call us.  5. Wound Care a. Remove your bandages the day after surgery.  Unless  discharge instructions indicate otherwise, leave your bandage dry and in place overnight.  Remove the bandage during your first bowel movement.   b. Allow the wound packing to fall out over the next few days.  You can trim exposed gauze / ribbon as it falls out.  You do not need to repack the wound unless instructed otherwise.  Wear an absorbent pad or soft cotton gauze in your underwear as needed to catch any drainage and help keep the area  c. Keep the area clean and dry.  Bathe / shower every day.  Keep the area clean by showering / bathing over the incision / wound.   It is okay to soak an open wound to help wash it.  Wet wipes or showers / gentle washing after bowel movements is often less traumatic than regular toilet paper. d. You may have some styrofoam-like soft packing in the rectum which will come out with the first bowel movement.  e. You will often notice bleeding with bowel movements.  This should slow down by the end of the first week of surgery f. Expect some drainage.  This should slow down, too, by the end of the first week of surgery.  Wear an absorbent pad or soft cotton gauze in your underwear until the drainage stops. 6. ACTIVITIES as tolerated:   a. You may resume regular (light) daily activities beginning the next day-such as daily self-care, walking, climbing stairs-gradually increasing activities as tolerated.  If you can walk 30 minutes without difficulty, it is safe to try more intense activity such as jogging, treadmill, bicycling, low-impact aerobics, swimming, etc. b. Save the most intensive and strenuous activity for last such as sit-ups, heavy lifting, contact sports, etc  Refrain from any heavy lifting or straining until you are off narcotics for pain control.   c. DO NOT PUSH THROUGH PAIN.  Let pain be your guide: If it hurts to do something, don't do it.  Pain is your body warning you to avoid that activity for another week until the pain goes down. d. You may drive when  you are no longer taking prescription pain medication, you can comfortably sit for long periods of time, and you can safely maneuver your car and apply brakes. e. You may have sexual intercourse when it is comfortable.  7. FOLLOW UP in our office a. Please call [facility] at [phone] to set up an appointment to see your surgeon in the office for a follow-up appointment approximately 2 weeks after your surgery. b. Make sure that you call for this appointment the day you arrive home to insure a convenient appointment time. 10. IF YOU HAVE DISABILITY OR FAMILY LEAVE FORMS, BRING THEM TO THE OFFICE FOR PROCESSING.  DO NOT GIVE THEM TO YOUR DOCTOR.        WHEN TO CALL US [phone]: 1. Poor pain control 2. Reactions / problems with new medications (rash/itching, nausea, etc)    3. Fever over 101.5 F (38.5 C) 4. Inability to urinate 5. Nausea and/or vomiting 6. Worsening swelling or bruising 7. Continued bleeding from incision. 8. Increased pain, redness, or drainage from the incision  The clinic staff is available to answer your questions during regular business hours (8:30am-5pm).  Please don't hesitate to call and ask to speak to one of our nurses for clinical concerns.   A surgeon from [facility] is always on call at the hospitals   If you have a medical emergency, go to the nearest emergency room or call 911.    [facility] [address], [address], [place], [place]  [address] ? MAIN: [phone] ? TOLL FREE: [phone] ? FAX [phone] [email or web address]  HEMORRHOIDS  The rectum is the last foot of your colon, and it naturally stretches to hold stool.  Hemorrhoidal piles are natural clusters of blood vessels that help the rectum and anal canal stretch to hold stool and allow bowel movements to eliminate feces.   Hemorrhoids are abnormally swollen blood vessels in the rectum.  Too much pressure in the rectum causes  hemorrhoids by forcing blood to stretch and bulge the walls of the veins, sometimes even rupturing them.  Hemorrhoids can become like varicose veins you might see on a person's legs.  Most people will develop a flare of hemorrhoids in their lifetime.  When bulging hemorrhoidal veins are irritated, they can swell, burn, itch, cause pain, and bleed.  Most flares will calm down gradually own within a few weeks.  However, once hemorrhoids are created, they are difficult to get rid of completely and tend to flare more easily than the first flare.   Fortunately, good habits and simple medical treatment usually control hemorrhoids well, and surgery is needed only in severe cases. Types of Hemorrhoids:  Internal hemorrhoids usually don't initially hurt or itch; they are deep inside the rectum and usually have no sensation. If they begin to push out (prolapse), pain and burning can occur.  However, internal hemorrhoids can bleed.  Anal bleeding should not be ignored since bleeding could come from a dangerous source like colorectal cancer, so persistent rectal bleeding should be investigated by a doctor, sometimes with a colonoscopy.  External hemorrhoids cause most of the symptoms - pain, burning, and itching. Nonirritated hemorrhoids can look like small skin tags coming out of the anus.   Thrombosed hemorrhoids can form when a hemorrhoid blood vessel bursts and causes the hemorrhoid to suddenly swell.  A purple blood clot can form in it and become an excruciatingly painful lump at the anus. Because of these unpleasant symptoms, immediate incision and drainage by a surgeon at an office visit can provide much relief of the pain.    PREVENTION Avoiding the most frequent causes listed below will prevent most cases of hemorrhoids: Constipation Hard stools Diarrhea  Constant sitting  Straining with bowel movements Sitting on the toilet for a long time  Severe coughing  episodes Pregnancy / Childbirth  Heavy  Lifting  Sometimes avoiding the above triggers is difficult:  How can you avoid sitting all day if you have a seated job? Also, we try to avoid coughing and diarrhea, but sometimes it's beyond your control.  Still, there are some practical hints to help: Keep the anal and genital area clean.  Moistened tissues such as flushable wet wipes are less irritating than toilet paper.  Using irrigating showers or bottle irrigation washing gently cleans this sensitive area.     Avoid dry toilet paper when cleaning after bowel movements.  . Keep the anal and genital area dry.  Lightly pat the rectal area dry.  Avoid rubbing.  Talcum or baby powders can help GET YOUR STOOLS SOFT.   This is the most important way to prevent irritated hemorrhoids.  Hard stools are like sandpaper to the anorectal canal and will cause more problems.  The goal: ONE SOFT BOWEL MOVEMENT A DAY!  BMs from every other day to 3 times a day is a tolerable range Treat coughing, diarrhea and constipation early since irritated hemorrhoids may soon follow.  If your main job activity is seated, always stand or walk during your breaks. Make it a point to stand and walk at least 5 minutes every hour and try to shift frequently in your chair to avoid direct rectal pressure.  Always exhale as you strain or lift. Don't hold your breath.  Do not delay or try to prevent a bowel movement when the urge is present. Exercise regularly (walking or jogging 60 minutes a day) to stimulate the bowels to move. No reading or other activity while on the toilet. If bowel movements take longer than 5 minutes, you are too constipated. AVOID CONSTIPATION Drink plenty of liquids (1 1/2 to 2 quarts of water and other fluids a day unless fluid restricted for another medical condition). Liquids that contain caffeine (coffee a, tea, soft drinks) can be dehydrating and should be avoided until constipation is controlled. Consider minimizing milk, as dairy products may be  constipating. Eat plenty of fiber (30g a day ideal, more if needed).  Fiber is the undigested part of plant food that passes into the colon, acting as "natures broom" to encourage bowel motility and movement.  Fiber can absorb and hold large amounts of water. This results in a larger, bulkier stool, which is soft and easier to pass.  Eating foods high in fiber - 12 servings - such as  Vegetables: Root (potatoes, carrots, turnips), Leafy green (lettuce, salad greens, celery, spinach), High residue (cabbage, broccoli, etc.) Fruit: Fresh, Dried (prunes, apricots, cherries), Stewed (applesauce)  Whole grain breads, pasta, whole wheat Bran cereals, muffins, etc. Consider adding supplemental bulking fiber which retains large volumes of water: Psyllium ground seeds (native plant from [place])--available as Metamucil, Konsyl, Effersyllium, Per Diem Fiber, or the less expensive generic forms.  Citrucel  (methylcellulose wood fiber) . FiberCon (Polycarbophil) Polyethylene Glycol - and "artificial" fiber commonly called Miralax or Glycolax.  It is helpful for people with gassy or bloated feelings with regular fiber Flax Seed - a less gassy natural fiber  Laxatives can be useful for a short period if constipation is severe Osmotics (Milk of Magnesia, Fleets Phospho-Soda, Magnesium Citrate)  Stimulants (Senokot,   Castor Oil,  Dulcolax, Ex-Lax)    Laxatives are not a good long-term solution as it can stress the bowels and cause too much mineral loss and dehydration.   Avoid taking laxatives for more than 7 days in a row.  AVOID DIARRHEA Switch to liquids and simpler foods for a few days to avoid stressing your intestines further. Avoid dairy products (especially milk & ice cream) for a short time.  The intestines often can lose the ability to digest lactose when stressed. Avoid foods that cause gassiness or bloating.  Typical foods include beans and other legumes, cabbage, broccoli, and dairy foods.   Every person has some sensitivity to other foods, so listen to your body and avoid those foods that trigger problems for   you. Adding fiber (Citrucel, Metamucil, FiberCon, Flax seed, Miralax) gradually can help thicken stools by absorbing excess fluid and retrain the intestines to act more normally.  Slowly increase the dose over a few weeks.  Too much fiber too soon can backfire and cause cramping & bloating. Probiotics (such as active yogurt, Align, etc) may help repopulate the intestines and colon with normal bacteria and calm down a sensitive digestive tract.  Most studies show it to be of mild help, though, and such products can be costly. Medicines: Bismuth subsalicylate (ex. Kayopectate, Pepto Bismol) every 30 minutes for up to 6 doses can help control diarrhea.  Avoid if pregnant. Loperamide (Immodium) can slow down diarrhea.  Start with two tablets (4mg total) first and then try one tablet every 6 hours.  Avoid if you are having fevers or severe pain.  If you are not better or start feeling worse, stop all medicines and call your doctor for advice Call your doctor if you are getting worse or not better.  Sometimes further testing (cultures, endoscopy, X-ray studies, bloodwork, etc) may be needed to help diagnose and treat the cause of the diarrhea. TREATMENT OF HEMORRHOID FLARE If these preventive measures fail, you must take action right away! Hemorrhoids are one condition that can be mild in the morning and become intolerable by nightfall. Most hemorrhoidal flares take several weeks to calm down.  These suggestions can help: Warm soaks.  This helps more than any topical medication.  Use up to 8 times a day.  Usually sitz baths or sitting in a warm bathtub helps.  Sitting on moist warm towels are helpful.  Switching to ice packs/cool compresses can be helpful  Use a Sitz Bath 4-8 times a day for relief A sitz bath is a warm water bath taken in the sitting position that covers only the hips and  buttocks. It may be used for either healing or hygiene purposes. Sitz baths are also used to relieve pain, itching, or muscle spasms. The water may contain medicine. Moist heat will help you heal and relax.  HOME CARE INSTRUCTIONS  Take 3 to 4 sitz baths a day. 6. Fill the bathtub half full with warm water. 7. Sit in the water and open the drain a little. 8. Turn on the warm water to keep the tub half full. Keep the water running constantly. 9. Soak in the water for 15 to 20 minutes. 10. After the sitz bath, pat the affected area dry first. SEEK MEDICAL CARE IF:  You get worse instead of better. Stop the sitz baths if you get worse.  Normalize your bowels.  Extremes of diarrhea or constipation will make hemorrhoids worse.  One soft bowel movement a day is the goal.  Fiber can help get your bowels regular Wet wipes instead of toilet paper Pain control with a NSAID such as ibuprofen (Advil) or naproxen (Aleve) or acetaminophen (Tylenol) around the clock.  Narcotics are constipating and should be minimized if possible Topical creams contain steroids (bydrocortisone) or local anesthetic (xylocaine) can help make pain and itching more tolerable.   EVALUATION If hemorrhoids are still causing problems, you could benefit by an evaluation by a surgeon.  The surgeon will obtain a history and examine you.  If hemorrhoids are diagnosed, some therapies can be offered in the office, usually with an anoscope into the less sensitive area of the rectum: -injection of hemorrhoids (sclerotherapy) can scar the blood vessels of the swollen/enlarged hemorrhoids to help shrink them down to   a more normal size -rubber banding of the enlarged hemorrhoids to help shrink them down to a more normal size -drainage of the blood clot causing a thrombosed hemorrhoid,  to relieve the severe pain   While 90% of the time such problems from hemorrhoids can be managed without preceding to surgery, sometimes the hemorrhoids require a  operation to control the problem (uncontrolled bleeding, prolapse, pain, etc.).   This involves being placed under general anesthesia where the surgeon can confirm the diagnosis and remove, suture, or staple the hemorrhoid(s).  Your surgeon can help you treat the problem appropriately.    GETTING TO GOOD BOWEL HEALTH. Irregular bowel habits such as constipation and diarrhea can lead to many problems over time.  Having one soft bowel movement a day is the most important way to prevent further problems.  The anorectal canal is designed to handle stretching and feces to safely manage our ability to get rid of solid waste (feces, poop, stool) out of our body.  BUT, hard constipated stools can act like ripping concrete bricks and diarrhea can be a burning fire to this very sensitive area of our body, causing inflamed hemorrhoids, anal fissures, increasing risk is perirectal abscesses, abdominal pain/bloating, an making irritable bowel worse.     The goal: ONE SOFT BOWEL MOVEMENT A DAY!  To have soft, regular bowel movements:    Drink at least 8 tall glasses of water a day.     Take plenty of fiber.  Fiber is the undigested part of plant food that passes into the colon, acting s "natures broom" to encourage bowel motility and movement.  Fiber can absorb and hold large amounts of water. This results in a larger, bulkier stool, which is soft and easier to pass. Work gradually over several weeks up to 6 servings a day of fiber (25g a day even more if needed) in the form of: o Vegetables -- Root (potatoes, carrots, turnips), leafy green (lettuce, salad greens, celery, spinach), or cooked high residue (cabbage, broccoli, etc) o Fruit -- Fresh (unpeeled skin & pulp), Dried (prunes, apricots, cherries, etc ),  or stewed ( applesauce)  o Whole grain breads, pasta, etc (whole wheat)  o Bran cereals    Bulking Agents -- This type of water-retaining fiber generally is easily obtained each day by one of the following:   o Psyllium bran -- The psyllium plant is remarkable because its ground seeds can retain so much water. This product is available as Metamucil, Konsyl, Effersyllium, Per Diem Fiber, or the less expensive generic preparation in drug and health food stores. Although labeled a laxative, it really is not a laxative.  o Methylcellulose -- This is another fiber derived from wood which also retains water. It is available as Citrucel. o Polyethylene Glycol - and "artificial" fiber commonly called Miralax or Glycolax.  It is helpful for people with gassy or bloated feelings with regular fiber o Flax Seed - a less gassy fiber than psyllium   No reading or other relaxing activity while on the toilet. If bowel movements take longer than 5 minutes, you are too constipated   AVOID CONSTIPATION.  High fiber and water intake usually takes care of this.  Sometimes a laxative is needed to stimulate more frequent bowel movements, but    Laxatives are not a good long-term solution as it can wear the colon out. o Osmotics (Milk of Magnesia, Fleets phosphosoda, Magnesium citrate, MiraLax, GoLytely) are safer than  o Stimulants (Senokot, Castor Oil, Dulcolax,   Ex Lax)    o Do not take laxatives for more than 7days in a row.    IF SEVERELY CONSTIPATED, try a Bowel Retraining Program: o Do not use laxatives.  o Eat a diet high in roughage, such as bran cereals and leafy vegetables.  o Drink six (6) ounces of prune or apricot juice each morning.  o Eat two (2) large servings of stewed fruit each day.  o Take one (1) heaping tablespoon of a psyllium-based bulking agent twice a day. Use sugar-free sweetener when possible to avoid excessive calories.  o Eat a normal breakfast.  o Set aside 15 minutes after breakfast to sit on the toilet, but do not strain to have a bowel movement.  o If you do not have a bowel movement by the third day, use an enema and repeat the above steps.    Controlling diarrhea o Switch to liquids and  simpler foods for a few days to avoid stressing your intestines further. o Avoid dairy products (especially milk & ice cream) for a short time.  The intestines often can lose the ability to digest lactose when stressed. o Avoid foods that cause gassiness or bloating.  Typical foods include beans and other legumes, cabbage, broccoli, and dairy foods.  Every person has some sensitivity to other foods, so listen to our body and avoid those foods that trigger problems for you. o Adding fiber (Citrucel, Metamucil, psyllium, Miralax) gradually can help thicken stools by absorbing excess fluid and retrain the intestines to act more normally.  Slowly increase the dose over a few weeks.  Too much fiber too soon can backfire and cause cramping & bloating. o Probiotics (such as active yogurt, Align, etc) may help repopulate the intestines and colon with normal bacteria and calm down a sensitive digestive tract.  Most studies show it to be of mild help, though, and such products can be costly. o Medicines:   Bismuth subsalicylate (ex. Kayopectate, Pepto Bismol) every 30 minutes for up to 6 doses can help control diarrhea.  Avoid if pregnant.   Loperamide (Immodium) can slow down diarrhea.  Start with two tablets (4mg total) first and then try one tablet every 6 hours.  Avoid if you are having fevers or severe pain.  If you are not better or start feeling worse, stop all medicines and call your doctor for advice o Call your doctor if you are getting worse or not better.  Sometimes further testing (cultures, endoscopy, X-ray studies, bloodwork, etc) may be needed to help diagnose and treat the cause of the diarrhea. o  

## 2013-09-01 ENCOUNTER — Other Ambulatory Visit: Payer: Self-pay

## 2013-09-01 DIAGNOSIS — Z1231 Encounter for screening mammogram for malignant neoplasm of breast: Secondary | ICD-10-CM

## 2013-09-10 ENCOUNTER — Ambulatory Visit: Payer: PRIVATE HEALTH INSURANCE

## 2014-11-24 ENCOUNTER — Other Ambulatory Visit: Payer: Self-pay | Admitting: Obstetrics & Gynecology

## 2014-11-25 LAB — CYTOLOGY - PAP

## 2015-07-05 DIAGNOSIS — R05 Cough: Secondary | ICD-10-CM | POA: Diagnosis not present

## 2015-07-05 DIAGNOSIS — Z1389 Encounter for screening for other disorder: Secondary | ICD-10-CM | POA: Diagnosis not present

## 2015-07-05 DIAGNOSIS — I951 Orthostatic hypotension: Secondary | ICD-10-CM | POA: Diagnosis not present

## 2015-08-25 DIAGNOSIS — M79671 Pain in right foot: Secondary | ICD-10-CM | POA: Diagnosis not present

## 2015-08-25 DIAGNOSIS — M722 Plantar fascial fibromatosis: Secondary | ICD-10-CM | POA: Diagnosis not present

## 2015-08-25 DIAGNOSIS — D2372 Other benign neoplasm of skin of left lower limb, including hip: Secondary | ICD-10-CM | POA: Diagnosis not present

## 2015-08-25 DIAGNOSIS — G5762 Lesion of plantar nerve, left lower limb: Secondary | ICD-10-CM | POA: Diagnosis not present

## 2015-09-14 DIAGNOSIS — D2372 Other benign neoplasm of skin of left lower limb, including hip: Secondary | ICD-10-CM | POA: Diagnosis not present

## 2015-09-14 DIAGNOSIS — G5762 Lesion of plantar nerve, left lower limb: Secondary | ICD-10-CM | POA: Diagnosis not present

## 2015-09-14 DIAGNOSIS — M7742 Metatarsalgia, left foot: Secondary | ICD-10-CM | POA: Diagnosis not present

## 2015-09-14 DIAGNOSIS — M722 Plantar fascial fibromatosis: Secondary | ICD-10-CM | POA: Diagnosis not present

## 2015-10-12 DIAGNOSIS — M545 Low back pain: Secondary | ICD-10-CM | POA: Diagnosis not present

## 2015-10-12 DIAGNOSIS — M9904 Segmental and somatic dysfunction of sacral region: Secondary | ICD-10-CM | POA: Diagnosis not present

## 2015-10-12 DIAGNOSIS — M9903 Segmental and somatic dysfunction of lumbar region: Secondary | ICD-10-CM | POA: Diagnosis not present

## 2015-10-12 DIAGNOSIS — M9905 Segmental and somatic dysfunction of pelvic region: Secondary | ICD-10-CM | POA: Diagnosis not present

## 2015-10-14 DIAGNOSIS — M9904 Segmental and somatic dysfunction of sacral region: Secondary | ICD-10-CM | POA: Diagnosis not present

## 2015-10-14 DIAGNOSIS — M545 Low back pain: Secondary | ICD-10-CM | POA: Diagnosis not present

## 2015-10-14 DIAGNOSIS — M9903 Segmental and somatic dysfunction of lumbar region: Secondary | ICD-10-CM | POA: Diagnosis not present

## 2015-10-14 DIAGNOSIS — M9905 Segmental and somatic dysfunction of pelvic region: Secondary | ICD-10-CM | POA: Diagnosis not present

## 2015-10-27 DIAGNOSIS — H10023 Other mucopurulent conjunctivitis, bilateral: Secondary | ICD-10-CM | POA: Diagnosis not present

## 2015-10-29 DIAGNOSIS — S0502XA Injury of conjunctiva and corneal abrasion without foreign body, left eye, initial encounter: Secondary | ICD-10-CM | POA: Diagnosis not present

## 2015-11-02 DIAGNOSIS — S0502XD Injury of conjunctiva and corneal abrasion without foreign body, left eye, subsequent encounter: Secondary | ICD-10-CM | POA: Diagnosis not present

## 2015-11-05 DIAGNOSIS — D2271 Melanocytic nevi of right lower limb, including hip: Secondary | ICD-10-CM | POA: Diagnosis not present

## 2015-11-05 DIAGNOSIS — D2262 Melanocytic nevi of left upper limb, including shoulder: Secondary | ICD-10-CM | POA: Diagnosis not present

## 2015-11-05 DIAGNOSIS — L821 Other seborrheic keratosis: Secondary | ICD-10-CM | POA: Diagnosis not present

## 2015-11-05 DIAGNOSIS — D1801 Hemangioma of skin and subcutaneous tissue: Secondary | ICD-10-CM | POA: Diagnosis not present

## 2015-11-08 DIAGNOSIS — J019 Acute sinusitis, unspecified: Secondary | ICD-10-CM | POA: Diagnosis not present

## 2015-11-08 DIAGNOSIS — B9689 Other specified bacterial agents as the cause of diseases classified elsewhere: Secondary | ICD-10-CM | POA: Diagnosis not present

## 2015-12-21 DIAGNOSIS — Z6823 Body mass index (BMI) 23.0-23.9, adult: Secondary | ICD-10-CM | POA: Diagnosis not present

## 2015-12-21 DIAGNOSIS — Z1231 Encounter for screening mammogram for malignant neoplasm of breast: Secondary | ICD-10-CM | POA: Diagnosis not present

## 2015-12-21 DIAGNOSIS — Z01419 Encounter for gynecological examination (general) (routine) without abnormal findings: Secondary | ICD-10-CM | POA: Diagnosis not present

## 2016-01-21 DIAGNOSIS — Z23 Encounter for immunization: Secondary | ICD-10-CM | POA: Diagnosis not present

## 2016-03-18 DIAGNOSIS — H1131 Conjunctival hemorrhage, right eye: Secondary | ICD-10-CM | POA: Diagnosis not present

## 2016-03-18 DIAGNOSIS — J0100 Acute maxillary sinusitis, unspecified: Secondary | ICD-10-CM | POA: Diagnosis not present

## 2016-04-03 DIAGNOSIS — H6982 Other specified disorders of Eustachian tube, left ear: Secondary | ICD-10-CM | POA: Diagnosis not present

## 2016-04-03 DIAGNOSIS — J0120 Acute ethmoidal sinusitis, unspecified: Secondary | ICD-10-CM | POA: Diagnosis not present

## 2016-04-17 DIAGNOSIS — J0120 Acute ethmoidal sinusitis, unspecified: Secondary | ICD-10-CM | POA: Diagnosis not present

## 2016-08-03 DIAGNOSIS — G8929 Other chronic pain: Secondary | ICD-10-CM | POA: Diagnosis not present

## 2016-08-03 DIAGNOSIS — R109 Unspecified abdominal pain: Secondary | ICD-10-CM | POA: Diagnosis not present

## 2016-08-03 DIAGNOSIS — H1010 Acute atopic conjunctivitis, unspecified eye: Secondary | ICD-10-CM | POA: Diagnosis not present

## 2016-08-03 DIAGNOSIS — J3089 Other allergic rhinitis: Secondary | ICD-10-CM | POA: Diagnosis not present

## 2016-08-08 DIAGNOSIS — G8929 Other chronic pain: Secondary | ICD-10-CM | POA: Diagnosis not present

## 2016-08-08 DIAGNOSIS — N281 Cyst of kidney, acquired: Secondary | ICD-10-CM | POA: Diagnosis not present

## 2016-08-08 DIAGNOSIS — R109 Unspecified abdominal pain: Secondary | ICD-10-CM | POA: Diagnosis not present

## 2016-10-13 DIAGNOSIS — N281 Cyst of kidney, acquired: Secondary | ICD-10-CM | POA: Diagnosis not present

## 2016-11-08 DIAGNOSIS — Z0000 Encounter for general adult medical examination without abnormal findings: Secondary | ICD-10-CM | POA: Diagnosis not present

## 2016-11-08 DIAGNOSIS — Z111 Encounter for screening for respiratory tuberculosis: Secondary | ICD-10-CM | POA: Diagnosis not present

## 2016-11-08 DIAGNOSIS — Z1389 Encounter for screening for other disorder: Secondary | ICD-10-CM | POA: Diagnosis not present

## 2016-11-08 DIAGNOSIS — Z23 Encounter for immunization: Secondary | ICD-10-CM | POA: Diagnosis not present

## 2016-11-10 DIAGNOSIS — Z23 Encounter for immunization: Secondary | ICD-10-CM | POA: Diagnosis not present

## 2016-11-10 DIAGNOSIS — Z283 Underimmunization status: Secondary | ICD-10-CM | POA: Diagnosis not present

## 2016-12-01 DIAGNOSIS — T2010XA Burn of first degree of head, face, and neck, unspecified site, initial encounter: Secondary | ICD-10-CM | POA: Diagnosis not present

## 2016-12-12 DIAGNOSIS — Z23 Encounter for immunization: Secondary | ICD-10-CM | POA: Diagnosis not present

## 2016-12-29 DIAGNOSIS — Z23 Encounter for immunization: Secondary | ICD-10-CM | POA: Diagnosis not present

## 2017-01-26 DIAGNOSIS — Z1231 Encounter for screening mammogram for malignant neoplasm of breast: Secondary | ICD-10-CM | POA: Diagnosis not present

## 2017-01-26 DIAGNOSIS — Z01419 Encounter for gynecological examination (general) (routine) without abnormal findings: Secondary | ICD-10-CM | POA: Diagnosis not present

## 2017-01-26 DIAGNOSIS — Z6823 Body mass index (BMI) 23.0-23.9, adult: Secondary | ICD-10-CM | POA: Diagnosis not present

## 2017-03-08 DIAGNOSIS — H16223 Keratoconjunctivitis sicca, not specified as Sjogren's, bilateral: Secondary | ICD-10-CM | POA: Diagnosis not present

## 2017-03-08 DIAGNOSIS — H5211 Myopia, right eye: Secondary | ICD-10-CM | POA: Diagnosis not present

## 2017-03-13 DIAGNOSIS — Z23 Encounter for immunization: Secondary | ICD-10-CM | POA: Diagnosis not present

## 2017-04-06 DIAGNOSIS — N281 Cyst of kidney, acquired: Secondary | ICD-10-CM | POA: Diagnosis not present

## 2017-04-19 DIAGNOSIS — H16223 Keratoconjunctivitis sicca, not specified as Sjogren's, bilateral: Secondary | ICD-10-CM | POA: Diagnosis not present

## 2017-06-21 DIAGNOSIS — M778 Other enthesopathies, not elsewhere classified: Secondary | ICD-10-CM | POA: Diagnosis not present

## 2017-06-21 DIAGNOSIS — Z6823 Body mass index (BMI) 23.0-23.9, adult: Secondary | ICD-10-CM | POA: Diagnosis not present

## 2017-06-21 DIAGNOSIS — J3089 Other allergic rhinitis: Secondary | ICD-10-CM | POA: Diagnosis not present

## 2017-07-03 DIAGNOSIS — J40 Bronchitis, not specified as acute or chronic: Secondary | ICD-10-CM | POA: Diagnosis not present

## 2017-07-03 DIAGNOSIS — J329 Chronic sinusitis, unspecified: Secondary | ICD-10-CM | POA: Diagnosis not present

## 2017-07-03 DIAGNOSIS — Z6822 Body mass index (BMI) 22.0-22.9, adult: Secondary | ICD-10-CM | POA: Diagnosis not present

## 2017-10-31 DIAGNOSIS — N390 Urinary tract infection, site not specified: Secondary | ICD-10-CM | POA: Diagnosis not present

## 2017-10-31 DIAGNOSIS — N760 Acute vaginitis: Secondary | ICD-10-CM | POA: Diagnosis not present

## 2017-10-31 DIAGNOSIS — Z113 Encounter for screening for infections with a predominantly sexual mode of transmission: Secondary | ICD-10-CM | POA: Diagnosis not present

## 2017-11-22 DIAGNOSIS — N760 Acute vaginitis: Secondary | ICD-10-CM | POA: Diagnosis not present

## 2018-01-15 DIAGNOSIS — Z23 Encounter for immunization: Secondary | ICD-10-CM | POA: Diagnosis not present

## 2018-01-15 DIAGNOSIS — J0120 Acute ethmoidal sinusitis, unspecified: Secondary | ICD-10-CM | POA: Diagnosis not present

## 2018-01-15 DIAGNOSIS — Z6823 Body mass index (BMI) 23.0-23.9, adult: Secondary | ICD-10-CM | POA: Diagnosis not present

## 2018-01-29 DIAGNOSIS — Z1231 Encounter for screening mammogram for malignant neoplasm of breast: Secondary | ICD-10-CM | POA: Diagnosis not present

## 2018-01-29 DIAGNOSIS — Z6823 Body mass index (BMI) 23.0-23.9, adult: Secondary | ICD-10-CM | POA: Diagnosis not present

## 2018-01-29 DIAGNOSIS — Z01419 Encounter for gynecological examination (general) (routine) without abnormal findings: Secondary | ICD-10-CM | POA: Diagnosis not present

## 2018-01-29 DIAGNOSIS — Z13820 Encounter for screening for osteoporosis: Secondary | ICD-10-CM | POA: Diagnosis not present

## 2018-02-15 DIAGNOSIS — Z0000 Encounter for general adult medical examination without abnormal findings: Secondary | ICD-10-CM | POA: Diagnosis not present

## 2018-02-15 DIAGNOSIS — Z6823 Body mass index (BMI) 23.0-23.9, adult: Secondary | ICD-10-CM | POA: Diagnosis not present

## 2018-02-15 DIAGNOSIS — Z1331 Encounter for screening for depression: Secondary | ICD-10-CM | POA: Diagnosis not present

## 2018-02-15 DIAGNOSIS — Z13220 Encounter for screening for lipoid disorders: Secondary | ICD-10-CM | POA: Diagnosis not present

## 2018-02-15 DIAGNOSIS — Z1339 Encounter for screening examination for other mental health and behavioral disorders: Secondary | ICD-10-CM | POA: Diagnosis not present

## 2018-03-05 DIAGNOSIS — H6121 Impacted cerumen, right ear: Secondary | ICD-10-CM | POA: Diagnosis not present

## 2018-03-05 DIAGNOSIS — H9202 Otalgia, left ear: Secondary | ICD-10-CM | POA: Diagnosis not present

## 2018-03-05 DIAGNOSIS — H6983 Other specified disorders of Eustachian tube, bilateral: Secondary | ICD-10-CM | POA: Diagnosis not present

## 2018-03-05 DIAGNOSIS — J310 Chronic rhinitis: Secondary | ICD-10-CM | POA: Diagnosis not present

## 2018-03-11 DIAGNOSIS — Z6823 Body mass index (BMI) 23.0-23.9, adult: Secondary | ICD-10-CM | POA: Diagnosis not present

## 2018-03-11 DIAGNOSIS — R05 Cough: Secondary | ICD-10-CM | POA: Diagnosis not present

## 2018-03-15 DIAGNOSIS — N281 Cyst of kidney, acquired: Secondary | ICD-10-CM | POA: Diagnosis not present

## 2018-03-22 DIAGNOSIS — N281 Cyst of kidney, acquired: Secondary | ICD-10-CM | POA: Diagnosis not present

## 2018-06-04 DIAGNOSIS — H01009 Unspecified blepharitis unspecified eye, unspecified eyelid: Secondary | ICD-10-CM | POA: Diagnosis not present

## 2018-06-10 DIAGNOSIS — H43813 Vitreous degeneration, bilateral: Secondary | ICD-10-CM | POA: Diagnosis not present

## 2018-12-10 DIAGNOSIS — M545 Low back pain: Secondary | ICD-10-CM | POA: Diagnosis not present

## 2018-12-18 DIAGNOSIS — M545 Low back pain: Secondary | ICD-10-CM | POA: Diagnosis not present

## 2018-12-20 DIAGNOSIS — M545 Low back pain: Secondary | ICD-10-CM | POA: Diagnosis not present

## 2018-12-27 DIAGNOSIS — Z23 Encounter for immunization: Secondary | ICD-10-CM | POA: Diagnosis not present

## 2019-01-02 DIAGNOSIS — Z23 Encounter for immunization: Secondary | ICD-10-CM | POA: Diagnosis not present

## 2019-11-17 DIAGNOSIS — Z23 Encounter for immunization: Secondary | ICD-10-CM | POA: Diagnosis not present

## 2019-12-08 DIAGNOSIS — Z6823 Body mass index (BMI) 23.0-23.9, adult: Secondary | ICD-10-CM | POA: Diagnosis not present

## 2019-12-08 DIAGNOSIS — I951 Orthostatic hypotension: Secondary | ICD-10-CM | POA: Diagnosis not present

## 2019-12-08 DIAGNOSIS — R002 Palpitations: Secondary | ICD-10-CM | POA: Diagnosis not present

## 2019-12-15 DIAGNOSIS — Z23 Encounter for immunization: Secondary | ICD-10-CM | POA: Diagnosis not present

## 2019-12-29 DIAGNOSIS — M67961 Unspecified disorder of synovium and tendon, right lower leg: Secondary | ICD-10-CM | POA: Diagnosis not present

## 2019-12-29 DIAGNOSIS — M25571 Pain in right ankle and joints of right foot: Secondary | ICD-10-CM | POA: Diagnosis not present

## 2019-12-29 DIAGNOSIS — M79671 Pain in right foot: Secondary | ICD-10-CM | POA: Diagnosis not present

## 2020-01-15 DIAGNOSIS — M67961 Unspecified disorder of synovium and tendon, right lower leg: Secondary | ICD-10-CM | POA: Diagnosis not present

## 2020-01-23 DIAGNOSIS — M67961 Unspecified disorder of synovium and tendon, right lower leg: Secondary | ICD-10-CM | POA: Diagnosis not present

## 2020-02-12 DIAGNOSIS — Z01419 Encounter for gynecological examination (general) (routine) without abnormal findings: Secondary | ICD-10-CM | POA: Diagnosis not present

## 2020-02-12 DIAGNOSIS — Z1231 Encounter for screening mammogram for malignant neoplasm of breast: Secondary | ICD-10-CM | POA: Diagnosis not present

## 2020-02-12 DIAGNOSIS — Z6823 Body mass index (BMI) 23.0-23.9, adult: Secondary | ICD-10-CM | POA: Diagnosis not present

## 2020-02-17 DIAGNOSIS — M66871 Spontaneous rupture of other tendons, right ankle and foot: Secondary | ICD-10-CM | POA: Diagnosis not present

## 2020-02-17 DIAGNOSIS — S86111A Strain of other muscle(s) and tendon(s) of posterior muscle group at lower leg level, right leg, initial encounter: Secondary | ICD-10-CM | POA: Diagnosis not present

## 2020-02-17 DIAGNOSIS — G8918 Other acute postprocedural pain: Secondary | ICD-10-CM | POA: Diagnosis not present

## 2020-02-17 DIAGNOSIS — M7671 Peroneal tendinitis, right leg: Secondary | ICD-10-CM | POA: Diagnosis not present

## 2020-02-18 ENCOUNTER — Other Ambulatory Visit: Payer: Self-pay | Admitting: Obstetrics & Gynecology

## 2020-02-18 DIAGNOSIS — R928 Other abnormal and inconclusive findings on diagnostic imaging of breast: Secondary | ICD-10-CM

## 2020-03-04 ENCOUNTER — Ambulatory Visit
Admission: RE | Admit: 2020-03-04 | Discharge: 2020-03-04 | Disposition: A | Discharge status: Home / Self Care | Payer: PRIVATE HEALTH INSURANCE | Source: Ambulatory Visit | Attending: Obstetrics & Gynecology | Admitting: Obstetrics & Gynecology

## 2020-03-04 ENCOUNTER — Ambulatory Visit
Admission: RE | Admit: 2020-03-04 | Discharge: 2020-03-04 | Disposition: A | Discharge status: Home / Self Care | Payer: BC Managed Care – PPO | Source: Ambulatory Visit | Attending: Obstetrics & Gynecology | Admitting: Obstetrics & Gynecology

## 2020-03-04 ENCOUNTER — Other Ambulatory Visit: Payer: Self-pay

## 2020-03-04 DIAGNOSIS — R928 Other abnormal and inconclusive findings on diagnostic imaging of breast: Secondary | ICD-10-CM

## 2020-03-04 DIAGNOSIS — N6489 Other specified disorders of breast: Secondary | ICD-10-CM | POA: Diagnosis not present

## 2020-03-30 DIAGNOSIS — Z79890 Hormone replacement therapy (postmenopausal): Secondary | ICD-10-CM | POA: Diagnosis not present

## 2020-03-30 DIAGNOSIS — Z13220 Encounter for screening for lipoid disorders: Secondary | ICD-10-CM | POA: Diagnosis not present

## 2020-03-30 DIAGNOSIS — N951 Menopausal and female climacteric states: Secondary | ICD-10-CM | POA: Diagnosis not present

## 2020-03-30 DIAGNOSIS — Z0000 Encounter for general adult medical examination without abnormal findings: Secondary | ICD-10-CM | POA: Diagnosis not present

## 2020-03-30 DIAGNOSIS — Z23 Encounter for immunization: Secondary | ICD-10-CM | POA: Diagnosis not present

## 2020-03-30 DIAGNOSIS — Z6822 Body mass index (BMI) 22.0-22.9, adult: Secondary | ICD-10-CM | POA: Diagnosis not present

## 2020-04-05 DIAGNOSIS — M6281 Muscle weakness (generalized): Secondary | ICD-10-CM | POA: Diagnosis not present

## 2020-04-05 DIAGNOSIS — M25571 Pain in right ankle and joints of right foot: Secondary | ICD-10-CM | POA: Diagnosis not present

## 2020-04-05 DIAGNOSIS — R262 Difficulty in walking, not elsewhere classified: Secondary | ICD-10-CM | POA: Diagnosis not present

## 2020-04-09 DIAGNOSIS — R262 Difficulty in walking, not elsewhere classified: Secondary | ICD-10-CM | POA: Diagnosis not present

## 2020-04-09 DIAGNOSIS — M25571 Pain in right ankle and joints of right foot: Secondary | ICD-10-CM | POA: Diagnosis not present

## 2020-04-09 DIAGNOSIS — M6281 Muscle weakness (generalized): Secondary | ICD-10-CM | POA: Diagnosis not present

## 2020-04-13 DIAGNOSIS — M6281 Muscle weakness (generalized): Secondary | ICD-10-CM | POA: Diagnosis not present

## 2020-04-13 DIAGNOSIS — R262 Difficulty in walking, not elsewhere classified: Secondary | ICD-10-CM | POA: Diagnosis not present

## 2020-04-13 DIAGNOSIS — M25571 Pain in right ankle and joints of right foot: Secondary | ICD-10-CM | POA: Diagnosis not present

## 2020-04-16 DIAGNOSIS — M25571 Pain in right ankle and joints of right foot: Secondary | ICD-10-CM | POA: Diagnosis not present

## 2020-04-16 DIAGNOSIS — R262 Difficulty in walking, not elsewhere classified: Secondary | ICD-10-CM | POA: Diagnosis not present

## 2020-04-16 DIAGNOSIS — M6281 Muscle weakness (generalized): Secondary | ICD-10-CM | POA: Diagnosis not present

## 2020-04-19 DIAGNOSIS — R262 Difficulty in walking, not elsewhere classified: Secondary | ICD-10-CM | POA: Diagnosis not present

## 2020-04-19 DIAGNOSIS — M6281 Muscle weakness (generalized): Secondary | ICD-10-CM | POA: Diagnosis not present

## 2020-04-19 DIAGNOSIS — M25571 Pain in right ankle and joints of right foot: Secondary | ICD-10-CM | POA: Diagnosis not present

## 2020-04-21 ENCOUNTER — Other Ambulatory Visit: Payer: Self-pay | Admitting: Obstetrics & Gynecology

## 2020-04-21 ENCOUNTER — Ambulatory Visit (INDEPENDENT_AMBULATORY_CARE_PROVIDER_SITE_OTHER): Payer: BC Managed Care – PPO | Admitting: Urology

## 2020-04-21 ENCOUNTER — Other Ambulatory Visit: Payer: Self-pay

## 2020-04-21 ENCOUNTER — Encounter: Payer: Self-pay | Admitting: Urology

## 2020-04-21 ENCOUNTER — Ambulatory Visit (HOSPITAL_COMMUNITY)
Admission: RE | Admit: 2020-04-21 | Discharge: 2020-04-21 | Disposition: A | Discharge status: Home / Self Care | Payer: BC Managed Care – PPO | Source: Ambulatory Visit | Attending: Urology | Admitting: Urology

## 2020-04-21 VITALS — BP 117/71 | HR 70 | Temp 98.7°F | Ht 66.5 in | Wt 140.0 lb

## 2020-04-21 DIAGNOSIS — N281 Cyst of kidney, acquired: Secondary | ICD-10-CM | POA: Diagnosis not present

## 2020-04-21 DIAGNOSIS — N6489 Other specified disorders of breast: Secondary | ICD-10-CM

## 2020-04-21 DIAGNOSIS — R3129 Other microscopic hematuria: Secondary | ICD-10-CM

## 2020-04-21 NOTE — Progress Notes (Signed)
Urological Symptom Review  Patient is experiencing the following symptoms: none   Review of Systems  Gastrointestinal (upper)  : Negative for upper GI symptoms  Gastrointestinal (lower) : Constipation  Constitutional : Negative for symptoms  Skin: Negative for skin symptoms  Eyes: Negative for eye symptoms  Ear/Nose/Throat : Sinus problems  Hematologic/Lymphatic: Bruise easily  Cardiovascular : Negative for cardiovascular symptoms  Respiratory : Negative for respiratory symptoms  Endocrine: Negative for endocrine symptoms  Musculoskeletal: Negative for musculoskeletal symptoms  Neurological: Negative for neurological symptoms  Psychologic: Negative for psychiatric symptoms

## 2020-04-21 NOTE — Progress Notes (Signed)
04/21/2020 9:23 AM   Tracy Cline 1955/06/14 [id number]  Referring provider: Lise Auer, MD [address] [place],  [place] [address]  Followup renal cysts  HPI: Tracy Cline is a 64yo here for followup for renal cysts. No flank pain. No gross hematuria. No LUTS. UA today shows 3-10 rbcs/hpf. She does not have a recent renal US. She deneis any urinary urgency, frequency, dysuria or gross hematuria.    PMH: Past Medical History:  Diagnosis Date  . GERD (gastroesophageal reflux disease)   . Heart murmur     Surgical History: Past Surgical History:  Procedure Laterality Date  . ANKLE SURGERY    . BREAST REDUCTION SURGERY  2012   Tracy Cline, M.D  . MINOR HEMORRHOIDECTOMY  2000   Dr Kendrick [place]  . SINUS EXPLORATION      Home Medications:  Allergies as of 04/21/2020      Reactions   Ampicillin Hives   Other Hives   Penicillins Swelling   Tetracyclines & Related Hives      Medication List       Accurate as of April 21, 2020  9:23 AM. If you have any questions, ask your nurse or doctor.        STOP taking these medications   beta carotene w/minerals tablet Stopped by: Wilkie Aye, MD   omega-3 acid ethyl esters 1 g capsule Commonly known as: LOVAZA Stopped by: Wilkie Aye, MD     TAKE these medications   aspirin 81 MG tablet Take 81 mg by mouth daily.   CombiPatch 0.05-0.14 MG/DAY Generic drug: estradiol-norethindrone Place 1 patch onto the skin 2 (two) times a week.   cyclobenzaprine 5 MG tablet Commonly known as: FLEXERIL Take 5 mg by mouth 3 (three) times daily as needed for muscle spasms.   estradiol 0.05 MG/24HR patch Commonly known as: VIVELLE-DOT Place 1 patch onto the skin once a week.   fexofenadine-pseudoephedrine 180-240 MG 24 hr tablet Commonly known as: ALLEGRA-D 24 Take 1 tablet by mouth daily.   fluticasone 50 MCG/ACT nasal spray Commonly known as: FLONASE Place into both nostrils daily.   levocetirizine  5 MG tablet Commonly known as: XYZAL Take 5 mg by mouth every evening.   methocarbamol 500 MG tablet Commonly known as: ROBAXIN Take by mouth.   naproxen 500 MG tablet Commonly known as: NAPROSYN Take 1 tablet (500 mg total) by mouth 2 (two) times daily with a meal.   rizatriptan 10 MG tablet Commonly known as: MAXALT Take 10 mg by mouth as needed for migraine. May repeat in 2 hours if needed   valACYclovir 1000 MG tablet Commonly known as: VALTREX Take 1,000 mg by mouth 2 (two) times daily.   Vitamin D3 50 MCG (2000 UT) Tabs Take by mouth.       Allergies:  Allergies  Allergen Reactions  . Ampicillin Hives  . Other Hives  . Penicillins Swelling  . Tetracyclines & Related Hives    Family History: History reviewed. No pertinent family history.  Social History:  reports that she has quit smoking. She does not have any smokeless tobacco history on file. She reports current alcohol use. She reports that she does not use drugs.  ROS: All other review of systems were reviewed and are negative except what is noted above in HPI  Physical Exam: BP 117/71   Pulse 70   Temp 98.7 F (37.1 C)   Ht 5' 6.5" (1.689 m)   Wt 140 lb (63.5  kg)   LMP 07/01/2012   BMI 22.26 kg/m   Constitutional:  Alert and oriented, No acute distress. HEENT: [place] AT, moist mucus membranes.  Trachea midline, no masses. Cardiovascular: No clubbing, cyanosis, or edema. Respiratory: Normal respiratory effort, no increased work of breathing. GI: Abdomen is soft, nontender, nondistended, no abdominal masses GU: No CVA tenderness.  Lymph: No cervical or inguinal lymphadenopathy. Skin: No rashes, bruises or suspicious lesions. Neurologic: Grossly intact, no focal deficits, moving all 4 extremities. Psychiatric: Normal mood and affect.  Laboratory Data: No results found for: WBC, HGB, HCT, MCV, PLT  No results found for: CREATININE  No results found for: PSA  No results found for:  TESTOSTERONE  No results found for: HGBA1C  Urinalysis No results found for: COLORURINE, APPEARANCEUR, LABSPEC, PHURINE, GLUCOSEU, HGBUR, BILIRUBINUR, KETONESUR, PROTEINUR, UROBILINOGEN, NITRITE, LEUKOCYTESUR  No results found for: LABMICR, WBCUA, RBCUA, LABEPIT, MUCUS, BACTERIA  Pertinent Imaging:  No results found for this or any previous visit.  No results found for this or any previous visit.  No results found for this or any previous visit.  No results found for this or any previous visit.  No results found for this or any previous visit.  No results found for this or any previous visit.  No results found for this or any previous visit.  No results found for this or any previous visit.   Assessment & Plan:    1. Renal cyst -Renal US  - Urinalysis, Routine w reflex microscopic - Basic metabolic panel - Ultrasound renal complete  2. Microhematuria -renal US, will call with results. RTC 6 months   Return in about 6 months (around 10/19/2020) for UA.  Wilkie Aye, MD  [facility] [place]

## 2020-04-21 NOTE — Patient Instructions (Signed)

## 2020-04-22 DIAGNOSIS — M25571 Pain in right ankle and joints of right foot: Secondary | ICD-10-CM | POA: Diagnosis not present

## 2020-04-22 DIAGNOSIS — M6281 Muscle weakness (generalized): Secondary | ICD-10-CM | POA: Diagnosis not present

## 2020-04-22 DIAGNOSIS — R262 Difficulty in walking, not elsewhere classified: Secondary | ICD-10-CM | POA: Diagnosis not present

## 2020-04-22 LAB — URINALYSIS, ROUTINE W REFLEX MICROSCOPIC
Bilirubin, UA: NEGATIVE
Glucose, UA: NEGATIVE
Ketones, UA: NEGATIVE
Leukocytes,UA: NEGATIVE
Nitrite, UA: NEGATIVE
Protein,UA: NEGATIVE
Specific Gravity, UA: 1.025 (ref 1.005–1.030)
Urobilinogen, Ur: 0.2 mg/dL (ref 0.2–1.0)
pH, UA: 5.5 (ref 5.0–7.5)

## 2020-04-22 LAB — MICROSCOPIC EXAMINATION: Renal Epithel, UA: NONE SEEN /hpf

## 2020-04-23 ENCOUNTER — Ambulatory Visit: Payer: PRIVATE HEALTH INSURANCE | Admitting: Urology

## 2020-04-29 DIAGNOSIS — R262 Difficulty in walking, not elsewhere classified: Secondary | ICD-10-CM | POA: Diagnosis not present

## 2020-04-29 DIAGNOSIS — M6281 Muscle weakness (generalized): Secondary | ICD-10-CM | POA: Diagnosis not present

## 2020-04-29 DIAGNOSIS — M25571 Pain in right ankle and joints of right foot: Secondary | ICD-10-CM | POA: Diagnosis not present

## 2020-05-03 ENCOUNTER — Telehealth: Payer: Self-pay

## 2020-05-03 NOTE — Progress Notes (Signed)
Results sent via my chart 

## 2020-05-04 DIAGNOSIS — M25571 Pain in right ankle and joints of right foot: Secondary | ICD-10-CM | POA: Diagnosis not present

## 2020-05-04 DIAGNOSIS — M6281 Muscle weakness (generalized): Secondary | ICD-10-CM | POA: Diagnosis not present

## 2020-05-04 DIAGNOSIS — R262 Difficulty in walking, not elsewhere classified: Secondary | ICD-10-CM | POA: Diagnosis not present

## 2020-05-12 NOTE — Telephone Encounter (Signed)
Patient called asking for renal u/s report. Message sent to MD.

## 2020-05-18 NOTE — Telephone Encounter (Signed)
She has bilateral renal cysts which are stable in appearance

## 2020-05-19 NOTE — Progress Notes (Signed)
Results sent via my chart 

## 2020-05-21 NOTE — Telephone Encounter (Signed)
Letter mailed by [facility] staff.

## 2020-05-25 ENCOUNTER — Ambulatory Visit: Payer: Self-pay | Admitting: Surgery

## 2020-05-25 DIAGNOSIS — K6289 Other specified diseases of anus and rectum: Secondary | ICD-10-CM | POA: Diagnosis not present

## 2020-05-25 DIAGNOSIS — K5909 Other constipation: Secondary | ICD-10-CM | POA: Diagnosis not present

## 2020-05-25 DIAGNOSIS — K641 Second degree hemorrhoids: Secondary | ICD-10-CM | POA: Diagnosis not present

## 2020-05-25 NOTE — H&P (Signed)
Tracy Cline Appointment: 05/25/2020 10:15 AM Location: [facility] Patient #: [id number] DOB: 10/06/1955 Married / Language: [place] / Race: White Female  History of Present Illness Tracy Sportsman MD; 05/25/2020 11:17 AM) The patient is a 65 year old female who presents with hemorrhoids. Note for "Hemorrhoids": ` ` ` Patient sent for surgical consultation at the request of self  Chief Complaint: Recurrent hemorrhoids ` ` The patient is a woman that is tender office for hemorrhoid issues for some time. Had external hemorrhoids removed in 2000 by Dr. Earlene Cline. Has had some intermittent pain and symptoms. She saw me in urgent office 2015 and trimmed off some hemorrhoids. I have not seen her since. Apparently she still remains constipated, moving her bowels twice a week. Takes intermittent Colace or MiraLAX but not on a regular bowel regimen. She feels like her hemorrhoids are back. Wondering if she can get another trimming today. Last colonoscopy 2014 showed internal hemorrhoids. She believes she is due for a ten-year follow-up in a few years. Dr. Braulio Cline down in [place]  No personal nor family history of GI/colon cancer, inflammatory bowel disease, irritable bowel syndrome, allergy such as Celiac Sprue, dietary/dairy problems, colitis, ulcers nor gastritis. No recent sick contacts/gastroenteritis. No travel outside the country. No changes in diet. No dysphagia to solids or liquids. No significant heartburn or reflux. No melena, hematemesis, coffee ground emesis. No evidence of prior gastric/peptic ulceration.  (Review of systems as stated in this history (HPI) or in the review of systems. Otherwise all other 12 point ROS are negative) ` ` ###########################################`  This patient encounter took 25 minutes today to perform the following: obtain history, perform exam, review outside records, interpret tests & imaging, counsel the patient on  their diagnosis; and, document this encounter, including findings & plan in the electronic health record (EHR).   Past Surgical History Tracy Coots, CNA; 05/25/2020 10:06 AM) Foot Surgery Bilateral. Mammoplasty; Reduction Bilateral.  Diagnostic Studies History Tracy Coots, CNA; 05/25/2020 10:06 AM) Colonoscopy 5-10 years ago Mammogram within last year Pap Smear 1-5 years ago  Allergies Tracy Coots, CNA; 05/25/2020 10:07 AM) Penicillins Ampicillin-Sulbactam Sodium *PENICILLINS* Tetracycline HCl *DERMATOLOGICALS* CeleBREX *ANALGESICS - ANTI-INFLAMMATORY* Meloxicam & Diet Manage Prod *ANALGESICS - ANTI-INFLAMMATORY* Meloxicam *ANALGESICS - ANTI-INFLAMMATORY* Allergies Reconciled  Medication History Tracy Coots, CNA; 05/25/2020 10:07 AM) valACYclovir HCl (1GM Tablet, Oral) Active. Celecoxib (200MG  Capsule, Oral) Active. Methocarbamol (500MG  Tablet, Oral) Active. Fluconazole (150MG  Tablet, Oral) Active. Meloxicam (7.5MG  Tablet, Oral) Active. Sulfamethoxazole-Trimethoprim (800-160MG  Tablet, Oral) Active. Clobetasol Propionate (0.05% Ointment, External) Active. CombiPatch (0.05-0.14MG /DAY Patch TW, Transdermal) Active. oxyCODONE HCl (5MG  Tablet, Oral) Active. HYDROcodone-Homatropine (5-1.5MG /5ML Syrup, Oral) Active. 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 Caffeine use Carbonated beverages, Coffee, Tea. No drug use Tobacco use Former smoker.  Family History , CNA; 05/25/2020 10:06 AM) Arthritis Sister. Diabetes Mellitus Brother, Mother. Heart Disease Father. Migraine Headache Sister. Thyroid problems Sister.  Pregnancy / Birth History Tracy Coots, CNA; 05/25/2020 10:06 AM) Age at menarche 17 years. Age of menopause [age] Contraceptive History Oral contraceptives. Gravida 0 Para 0  Other Problems 07/25/2020, CNA; 05/25/2020  10:06 AM) Gastroesophageal Reflux Disease Heart murmur Hemorrhoids Migraine Headache     Review of Systems 07/25/2020 [name] CNA; 05/25/2020 10:06 AM) General Present- Night Sweats. Not Present- Appetite Loss, Chills, Fatigue, Fever, Weight Gain and Weight Loss. HEENT Present- Wears glasses/contact lenses. Not Present- Earache, Hearing Loss, Hoarseness, Nose Bleed, Oral Ulcers, Ringing in the Ears, Seasonal Allergies, Sinus  Pain, Sore Throat, Visual Disturbances and Yellow Eyes. Cardiovascular Not Present- Chest Pain, Difficulty Breathing Lying Down, Leg Cramps, Palpitations, Rapid Heart Rate, Shortness of Breath and Swelling of Extremities. Gastrointestinal Present- Hemorrhoids. Not Present- Abdominal Pain, Bloating, Bloody Stool, Change in Bowel Habits, Chronic diarrhea, Constipation, Difficulty Swallowing, Excessive gas, Gets full quickly at meals, Indigestion, Nausea, Rectal Pain and Vomiting. Female Genitourinary Not Present- Frequency, Nocturia, Painful Urination, Pelvic Pain and Urgency. Hematology Present- Easy Bruising. Not Present- Blood Thinners, Excessive bleeding, Gland problems, HIV and Persistent Infections.  Vitals (Tracy [name] CNA; 05/25/2020 10:08 AM) 05/25/2020 10:08 AM Weight: 143.38 lb Height: 67in Body Surface Area: 1.76 m Body Mass Index: 22.46 kg/m  Temp.: 97.31F  Pulse: 98 (Regular)  P.OX: 99% (Room air) BP: 120/70(Sitting, Left Arm, Standard)        Physical Exam Tracy Sportsman MD; 05/25/2020 11:18 AM)  General Mental Status-Alert. General Appearance-Not in acute distress, Not Sickly. Orientation-Oriented X3. Hydration-Well hydrated. Voice-Normal.  Integumentary Global Assessment Upon inspection and palpation of skin surfaces of the - Axillae: non-tender, no inflammation or ulceration, no drainage. and Distribution of scalp and body hair is normal. General Characteristics Temperature - normal warmth is noted.  Head and  Neck Head-normocephalic, atraumatic with no lesions or palpable masses. Face Global Assessment - atraumatic, no absence of expression. Neck Global Assessment - no abnormal movements, no bruit auscultated on the right, no bruit auscultated on the left, no decreased range of motion, non-tender. Trachea-midline. Thyroid Gland Characteristics - non-tender.  Eye Eyeball - Left-Extraocular movements intact, No Nystagmus - Left. Eyeball - Right-Extraocular movements intact, No Nystagmus - Right. Cornea - Left-No Hazy - Left. Cornea - Right-No Hazy - Right. Sclera/Conjunctiva - Left-No scleral icterus, No Discharge - Left. Sclera/Conjunctiva - Right-No scleral icterus, No Discharge - Right. Pupil - Left-Direct reaction to light normal. Pupil - Right-Direct reaction to light normal.  ENMT Ears Pinna - Left - no drainage observed, no generalized tenderness observed. Pinna - Right - no drainage observed, no generalized tenderness observed. Nose and Sinuses External Inspection of the Nose - no destructive lesion observed. Inspection of the nares - Left - quiet respiration. Inspection of the nares - Right - quiet respiration. Mouth and Throat Lips - Upper Lip - no fissures observed, no pallor noted. Lower Lip - no fissures observed, no pallor noted. Nasopharynx - no discharge present. Oral Cavity/Oropharynx - Tongue - no dryness observed. Oral Mucosa - no cyanosis observed. Hypopharynx - no evidence of airway distress observed.  Chest and Lung Exam Inspection Movements - Normal and Symmetrical. Accessory muscles - No use of accessory muscles in breathing. Palpation Palpation of the chest reveals - Non-tender. Auscultation Breath sounds - Normal and Clear.  Cardiovascular Auscultation Rhythm - Regular. Murmurs & Other Heart Sounds - Auscultation of the heart reveals - No Murmurs and No Systolic Clicks.  Abdomen Inspection Inspection of the abdomen reveals - No Visible  peristalsis and No Abnormal pulsations. Umbilicus - No Bleeding, No Urine drainage. Palpation/Percussion Palpation and Percussion of the abdomen reveal - Soft, Non Tender, No Rebound tenderness, No Rigidity (guarding) and No Cutaneous hyperesthesia. Note: Abdomen soft. Not severely distended. No distasis recti. No umbilical or other anterior abdominal wall hernias  Female Genitourinary Sexual Maturity Tanner 5 - Adult hair pattern. Note: No vaginal bleeding nor discharge  Rectal Note: see anoscopy - anal canal posterior ulcerative mobile mass. external hemorrhoids. Grade 2/3 int hemorrhoids  Peripheral Vascular Upper Extremity Inspection - Left - No Cyanotic nailbeds - Left, Not  Ischemic. Inspection - Right - No Cyanotic nailbeds - Right, Not Ischemic.  Neurologic Neurologic evaluation reveals -normal attention span and ability to concentrate, able to name objects and repeat phrases. Appropriate fund of knowledge , normal sensation and normal coordination. Mental Status Affect - not angry, not paranoid. Cranial Nerves-Normal Bilaterally. Gait-Normal.  Neuropsychiatric Mental status exam performed with findings of-able to articulate well with normal speech/language, rate, volume and coherence, thought content normal with ability to perform basic computations and apply abstract reasoning and no evidence of hallucinations, delusions, obsessions or homicidal/suicidal ideation.  Musculoskeletal Global Assessment Spine, Ribs and Pelvis - no instability, subluxation or laxity. Right Upper Extremity - no instability, subluxation or laxity.  Lymphatic Head & Neck  General Head & Neck Lymphatics: Bilateral - Description - No Localized lymphadenopathy. Axillary  General Axillary Region: Bilateral - Description - No Localized lymphadenopathy. Femoral & Inguinal  Generalized Femoral & Inguinal Lymphatics: Left - Description - No Localized lymphadenopathy. Right - Description  - No Localized lymphadenopathy.   Results Tracy Cline(Bellatrix Devonshire C. Saniya Tranchina MD; 05/25/2020 10:51 AM) Procedures  Name Value Date Hemorrhoids Procedure Anal exam: External Hemorrhoid Other: Some scarring in the left posterior midline along the anal canal with 15 mm mobile mass. Most likely old scar?Marland Kitchen. Atypical. Most sensitive area............Marland Kitchen.Tolerates digital and anoscopic exam. Mild right anterior external hemorrhoid. Grade 2 internal hemorrhoids. No major prolapse on today's exam. No proctitis. No pilonidal disease. No definite fissure. Sphincter tone normal.  Performed: 05/25/2020 10:37 AM    Assessment & Plan Tracy Cline(Markese [place] C. Sir Mallis MD; 05/25/2020 10:51 AM)  MASS OF ANUS (K62.89) Impression: Pleasant woman with prior hemorrhoid surgery now with ulcerated mass of anal canal with intermittent bleeding and prolapse. Risks the endometrium underwhelming.  I think she would benefit from examination under anesthesia with probable removal. Hopefully is just old scar but hard to say. This allow an opportunity for hemorrhoidal ligation and pexy. Outpatient surgery. She is interested in proceeding.  I noted that her recurrent hemorrhoidal/anorectal issues or side effect of her chronic constipation. Strongly recommend she take something every day. Most likely MiraLAX to-4 doses a day to compensate for her constipation. She is more interested in considering that.  Keep colonoscopy appointment scheduled for 2014.   PROLAPSED INTERNAL HEMORRHOIDS, GRADE 2 (K64.1)  Current Plans ANOSCOPY, DIAGNOSTIC (04540(46600) Pt Education - [facility] Hemorrhoids (Gaylan Fauver): discussed with patient and provided information.  CHRONIC CONSTIPATION (K59.09)  Current Plans Pt Education - [facility] Constipation (AT) Pt Education - [facility] Good Bowel Health (Milissa Fesperman)  ENCOUNTER FOR PREOPERATIVE EXAMINATION FOR GENERAL SURGICAL PROCEDURE (Z01.818)  Current Plans You are being scheduled for surgery- Our schedulers will call you.  You should  hear from our office's scheduling department within 5 working days about the location, date, and time of surgery. We try to make accommodations for patient's preferences in scheduling surgery, but sometimes the OR schedule or the surgeon's schedule prevents us from making those accommodations.  If you have not heard from our office [phone]) in 5 working days, call the office and ask for your surgeon's nurse.  If you have other questions about your diagnosis, plan, or surgery, call the office and ask for your surgeon's nurse.  Pt Education - [facility] Rectal Prep for Anorectal outpatient/office surgery: discussed with patient and provided information. Pt Education - [facility] Rectal Surgery HCI (Alante Weimann): discussed with patient and provided information. The anatomy & physiology of the anorectal region was discussed. The pathophysiology of hemorrhoids and differential diagnosis was discussed. Natural history risks without surgery was discussed.  I stressed the importance of a bowel regimen to have daily soft bowel movements to minimize progression of disease. Interventions such as sclerotherapy & banding were discussed.  The patient's symptoms are not adequately controlled by medicines and other non-operative treatments. I feel the risks & problems of no surgery outweigh the operative risks; therefore, I recommended surgery to treat the hemorrhoids by ligation, pexy, and possible resection.  Risks such as bleeding, infection, urinary difficulties, need for further treatment, heart attack, death, and other risks were discussed. I noted a good likelihood this will help address the problem. Goals of post-operative recovery were discussed as well. Possibility that this will not correct all symptoms was explained. Post-operative pain, bleeding, constipation, and other problems after surgery were discussed. We will work to minimize complications. Educational handouts further explaining the pathology,  treatment options, and bowel regimen were given as well. Questions were answered. The patient expresses understanding & wishes to proceed with surgery.  You are being scheduled for surgery- Our schedulers will call you.  You should hear from our office's scheduling department within 5 working days about the location, date, and time of surgery. We try to make accommodations for patient's preferences in scheduling surgery, but sometimes the OR schedule or the surgeon's schedule prevents [place] from making those accommodations.  If you have not heard from our office [phone]) in 5 working days, call the office and ask for your surgeon's nurse.  If you have other questions about your diagnosis, plan, or surgery, call the office and ask for your surgeon's nurse.  Tracy Sportsman, MD, FACS, MASCRS  Gastrointestinal and Minimally Invasive Surgery  [facility] [address], [address] [place], [place] [address] [phone] Fax [phone] Main/Paging  CONTACT INFORMATION: Weekday (9AM-5PM) concerns: Call [facility] main office at [phone] Weeknight (5PM-9AM) or Weekend/Holiday concerns: Check [email or web address] for General Surgery [facility] coverage (Please, do not use SecureChat as it is not reliable communication to operating surgeons for immediate patient care)

## 2020-06-01 DIAGNOSIS — D2262 Melanocytic nevi of left upper limb, including shoulder: Secondary | ICD-10-CM | POA: Diagnosis not present

## 2020-06-01 DIAGNOSIS — L918 Other hypertrophic disorders of the skin: Secondary | ICD-10-CM | POA: Diagnosis not present

## 2020-06-01 DIAGNOSIS — L821 Other seborrheic keratosis: Secondary | ICD-10-CM | POA: Diagnosis not present

## 2020-06-07 DIAGNOSIS — M67961 Unspecified disorder of synovium and tendon, right lower leg: Secondary | ICD-10-CM | POA: Diagnosis not present

## 2020-06-07 DIAGNOSIS — J0120 Acute ethmoidal sinusitis, unspecified: Secondary | ICD-10-CM | POA: Diagnosis not present

## 2020-06-07 DIAGNOSIS — Z6824 Body mass index (BMI) 24.0-24.9, adult: Secondary | ICD-10-CM | POA: Diagnosis not present

## 2020-06-29 DIAGNOSIS — Z01818 Encounter for other preprocedural examination: Secondary | ICD-10-CM | POA: Diagnosis not present

## 2020-07-02 ENCOUNTER — Other Ambulatory Visit: Payer: Self-pay | Admitting: Surgery

## 2020-07-02 DIAGNOSIS — K602 Anal fissure, unspecified: Secondary | ICD-10-CM | POA: Diagnosis not present

## 2020-07-02 DIAGNOSIS — K620 Anal polyp: Secondary | ICD-10-CM | POA: Diagnosis not present

## 2020-07-02 DIAGNOSIS — K644 Residual hemorrhoidal skin tags: Secondary | ICD-10-CM | POA: Diagnosis not present

## 2020-07-02 DIAGNOSIS — K642 Third degree hemorrhoids: Secondary | ICD-10-CM | POA: Diagnosis not present

## 2020-07-02 DIAGNOSIS — K626 Ulcer of anus and rectum: Secondary | ICD-10-CM | POA: Diagnosis not present

## 2020-07-03 ENCOUNTER — Telehealth: Payer: Self-pay | Admitting: Surgery

## 2020-07-03 NOTE — Telephone Encounter (Signed)
Tracy Cline  09-Mar-1956 [id number]  Patient Care Team: Lise Auer, MD as PCP - General (Family Medicine) Misenheimer, Marcial Pacas, MD as Consulting Physician (Gastroenterology) Karie Soda, MD as Consulting Physician (General Surgery)  This patient is a 65 y.o.female who calls today for surgical evaluation.   Date of procedure/visit: April 2022  Surgery: Hemorrhoid ligation  Reason for call: Face red.  Patient status post hemorrhoidal ligation and hemorrhoidectomy last week on oxycodone.  She notes that she is breathing fine and her pain is under control.  She noticed her face felt flushed.  No rash or hives.  She has had hives to other medications.  This does not feel like them.  She was concerned.  No itching.  No difficulty breathing or swallowing.  Talking normally.  Recommend she take Benadryl.  Can try some topical antiitching cream if needed.  She notes sometimes she will get flushed with prednisone though.  Asked her to monitor it since its only been a couple of hours.  If markedly worsens, may need to go to the ER.  Otherwise follow-up in the office on Monday.  Patient apparently had urinary issues and had Foley catheter placed so is due for CCS f/u on Monday  Patient Active Problem List   Diagnosis Date Noted  . Renal cyst 04/21/2020  . Microhematuria 04/21/2020  . Internal prolapsing hemorrhoid s/p hemorrhoidectomy 04/25/2013 04/25/2013  . External hemorrhoids with complication s/p ext hemorrhoidectomy 04/25/2013 04/25/2013    Past Medical History:  Diagnosis Date  . GERD (gastroesophageal reflux disease)   . Heart murmur     Past Surgical History:  Procedure Laterality Date  . ANKLE SURGERY    . BREAST REDUCTION SURGERY  2012   Mary A. Contogiannis, M.D  . MINOR HEMORRHOIDECTOMY  2000   Dr Kendrick Ranch  . SINUS EXPLORATION      Social History   Socioeconomic History  . Marital status: Married    Spouse name: Not on file  . Number of children: Not on file  .  Years of education: Not on file  . Highest education level: Not on file  Occupational History  . Not on file  Tobacco Use  . Smoking status: Former Games developer  . Smokeless tobacco: Not on file  Substance and Sexual Activity  . Alcohol use: Yes    Comment: wine  . Drug use: No  . Sexual activity: Not on file  Other Topics Concern  . Not on file  Social History Narrative  . Not on file   Social Determinants of Health   Financial Resource Strain: Not on file  Food Insecurity: Not on file  Transportation Needs: Not on file  Physical Activity: Not on file  Stress: Not on file  Social Connections: Not on file  Intimate Partner Violence: Not on file    No family history on file.  Current Outpatient Medications  Medication Sig Dispense Refill  . aspirin 81 MG tablet Take 81 mg by mouth daily.    . Cholecalciferol (VITAMIN D3) 2000 UNITS TABS Take by mouth.    . COMBIPATCH 0.05-0.14 MG/DAY Place 1 patch onto the skin 2 (two) times a week.    . cyclobenzaprine (FLEXERIL) 5 MG tablet Take 5 mg by mouth 3 (three) times daily as needed for muscle spasms.    Marland Kitchen estradiol (VIVELLE-DOT) 0.05 MG/24HR patch Place 1 patch onto the skin once a week.    . fexofenadine-pseudoephedrine (ALLEGRA-D 24) 180-240 MG per 24 hr tablet Take 1 tablet by  mouth daily.    . fluticasone (FLONASE) 50 MCG/ACT nasal spray Place into both nostrils daily.    Marland Kitchen levocetirizine (XYZAL) 5 MG tablet Take 5 mg by mouth every evening.    . methocarbamol (ROBAXIN) 500 MG tablet Take by mouth.    . naproxen (NAPROSYN) 500 MG tablet Take 1 tablet (500 mg total) by mouth 2 (two) times daily with a meal. 40 tablet 1  . rizatriptan (MAXALT) 10 MG tablet Take 10 mg by mouth as needed for migraine. May repeat in 2 hours if needed    . valACYclovir (VALTREX) 1000 MG tablet Take 1,000 mg by mouth 2 (two) times daily.     No current facility-administered medications for this visit.     Allergies  Allergen Reactions  . Ampicillin  Hives  . Other Hives  . Penicillins Swelling  . Tetracyclines & Related Hives    @VS @  No results found.  Note: This dictation was prepared with Dragon/digital dictation along with . Any transcriptional errors that result from this process are unintentional.   .[employer], M.D., F.A.C.S. Gastrointestinal and Minimally Invasive Surgery [facility] [address], [address] [place], [place] [phone] Main / Paging  07/03/2020 9:28 PM

## 2020-09-03 ENCOUNTER — Other Ambulatory Visit: Payer: Self-pay

## 2020-09-03 ENCOUNTER — Ambulatory Visit
Admission: RE | Admit: 2020-09-03 | Discharge: 2020-09-03 | Disposition: A | Discharge status: Home / Self Care | Payer: BC Managed Care – PPO | Source: Ambulatory Visit | Attending: Obstetrics & Gynecology | Admitting: Obstetrics & Gynecology

## 2020-09-03 ENCOUNTER — Ambulatory Visit: Payer: BC Managed Care – PPO

## 2020-09-03 ENCOUNTER — Other Ambulatory Visit: Payer: Self-pay | Admitting: Obstetrics & Gynecology

## 2020-09-03 DIAGNOSIS — N6489 Other specified disorders of breast: Secondary | ICD-10-CM

## 2020-09-03 DIAGNOSIS — R922 Inconclusive mammogram: Secondary | ICD-10-CM | POA: Diagnosis not present

## 2020-09-06 ENCOUNTER — Ambulatory Visit (INDEPENDENT_AMBULATORY_CARE_PROVIDER_SITE_OTHER): Payer: BC Managed Care – PPO | Admitting: Urology

## 2020-09-06 ENCOUNTER — Encounter: Payer: Self-pay | Admitting: Urology

## 2020-09-06 ENCOUNTER — Other Ambulatory Visit: Payer: Self-pay

## 2020-09-06 VITALS — BP 130/76 | HR 79

## 2020-09-06 DIAGNOSIS — R3129 Other microscopic hematuria: Secondary | ICD-10-CM | POA: Diagnosis not present

## 2020-09-06 LAB — URINALYSIS, ROUTINE W REFLEX MICROSCOPIC
Bilirubin, UA: NEGATIVE
Glucose, UA: NEGATIVE
Ketones, UA: NEGATIVE
Leukocytes,UA: NEGATIVE
Nitrite, UA: NEGATIVE
Protein,UA: NEGATIVE
Specific Gravity, UA: 1.01 (ref 1.005–1.030)
Urobilinogen, Ur: 0.2 mg/dL (ref 0.2–1.0)
pH, UA: 6 (ref 5.0–7.5)

## 2020-09-06 LAB — MICROSCOPIC EXAMINATION
Epithelial Cells (non renal): >10 /hpf — AB (ref 0–10)
Renal Epithel, UA: NONE SEEN /hpf

## 2020-09-06 NOTE — Progress Notes (Signed)
09/06/2020 10:27 AM   Dub Mikes 03/26/1956 [id number]  Referring provider: Lise Auer, MD [address] [place],  [place] [address]  Followup microhematuria   HPI: Ms Roca is a 65yo here for followup for microhematuria. UA today shows no RBCs. NO LUTS. No flank pain.    PMH: Past Medical History:  Diagnosis Date   GERD (gastroesophageal reflux disease)    Heart murmur     Surgical History: Past Surgical History:  Procedure Laterality Date   ANKLE SURGERY     BREAST REDUCTION SURGERY  2012   Mary A. Contogiannis, M.D   MINOR HEMORRHOIDECTOMY  2000   Dr Kendrick [place]   SINUS EXPLORATION      Home Medications:  Allergies as of 09/06/2020       Reactions   Ampicillin Hives   Other Hives   Penicillins Swelling   Tetracyclines & Related Hives        Medication List        Accurate as of September 06, 2020 10:27 AM. If you have any questions, ask your nurse or doctor.          aspirin 81 MG tablet Take 81 mg by mouth daily.   CombiPatch 0.05-0.14 MG/DAY Generic drug: estradiol-norethindrone Place 1 patch onto the skin 2 (two) times a week.   cyclobenzaprine 5 MG tablet Commonly known as: FLEXERIL Take 5 mg by mouth 3 (three) times daily as needed for muscle spasms.   estradiol 0.05 MG/24HR patch Commonly known as: VIVELLE-DOT Place 1 patch onto the skin once a week.   fexofenadine-pseudoephedrine 180-240 MG 24 hr tablet Commonly known as: ALLEGRA-D 24 Take 1 tablet by mouth daily.   fluticasone 50 MCG/ACT nasal spray Commonly known as: FLONASE Place into both nostrils daily.   levocetirizine 5 MG tablet Commonly known as: XYZAL Take 5 mg by mouth every evening.   methocarbamol 500 MG tablet Commonly known as: ROBAXIN Take by mouth.   naproxen 500 MG tablet Commonly known as: NAPROSYN Take 1 tablet (500 mg total) by mouth 2 (two) times daily with a meal.   rizatriptan 10 MG tablet Commonly known as: MAXALT Take 10 mg by mouth as  needed for migraine. May repeat in 2 hours if needed   valACYclovir 1000 MG tablet Commonly known as: VALTREX Take 1,000 mg by mouth 2 (two) times daily.   Vitamin D3 50 MCG (2000 UT) Tabs Take by mouth.        Allergies:  Allergies  Allergen Reactions   Ampicillin Hives   Other Hives   Penicillins Swelling   Tetracyclines & Related Hives    Family History: No family history on file.  Social History:  reports that she has quit smoking. She does not have any smokeless tobacco history on file. She reports current alcohol use. She reports that she does not use drugs.  ROS: All other review of systems were reviewed and are negative except what is noted above in HPI  Physical Exam: BP 130/76   Pulse 79   LMP 07/01/2012   Constitutional:  Alert and oriented, No acute distress. HEENT: [place] AT, moist mucus membranes.  Trachea midline, no masses. Cardiovascular: No clubbing, cyanosis, or edema. Respiratory: Normal respiratory effort, no increased work of breathing. GI: Abdomen is soft, nontender, nondistended, no abdominal masses GU: No CVA tenderness.  Lymph: No cervical or inguinal lymphadenopathy. Skin: No rashes, bruises or suspicious lesions. Neurologic: Grossly intact, no focal deficits, moving all 4 extremities. Psychiatric: Normal  mood and affect.  Laboratory Data: No results found for: WBC, HGB, HCT, MCV, PLT  No results found for: CREATININE  No results found for: PSA  No results found for: TESTOSTERONE  No results found for: HGBA1C  Urinalysis    Component Value Date/Time   APPEARANCEUR Clear 04/21/2020 0938   GLUCOSEU Negative 04/21/2020 0938   BILIRUBINUR Negative 04/21/2020 0938   PROTEINUR Negative 04/21/2020 0938   NITRITE Negative 04/21/2020 0938   LEUKOCYTESUR Negative 04/21/2020 0938    Lab Results  Component Value Date   LABMICR See below: 04/21/2020   WBCUA 0-5 04/21/2020   LABEPIT 0-10 04/21/2020   BACTERIA Few 04/21/2020     Pertinent Imaging:  No results found for this or any previous visit.  No results found for this or any previous visit.  No results found for this or any previous visit.  No results found for this or any previous visit.  Results for orders placed in visit on 04/21/20  Ultrasound renal complete  Narrative CLINICAL DATA:  Hematuria and history of renal cyst  EXAM: RENAL / URINARY TRACT ULTRASOUND COMPLETE  COMPARISON:  04/06/2017  FINDINGS: Right Kidney:  Renal measurements: 9.9 x 4.3 x 6.8 cm. = volume: 151 mL. 1 cm cyst is noted in the upper pole of the right kidney. This is stable from the prior ultrasound.  Left Kidney:  Renal measurements: 10.6 x 6.0 x 5.5 cm. = volume: 185 mL. No mass lesion or hydronephrosis is noted. Previously seen left renal cyst is less well appreciated on today's exam. Some cortical calcifications are noted within the kidney. No definitive renal calculi are seen.  Bladder:  Appears normal for degree of bladder distention.  Other:  None.  IMPRESSION: Stable right renal cyst.  Previously seen left renal cyst is not well appreciated on today's exam.   Electronically Signed By: Alcide Clever M.D. On: 04/21/2020 10:51  No results found for this or any previous visit.  No results found for this or any previous visit.  No results found for this or any previous visit.   Assessment & Plan:    1. Microhematuria -RTC 6 months with renal US - Urinalysis, Routine w reflex microscopic   No follow-ups on file.  Wilkie Aye, MD  [facility] [place]

## 2020-09-06 NOTE — Progress Notes (Signed)
Urological Symptom Review  Patient is experiencing the following symptoms: none   Review of Systems  Gastrointestinal (upper)  : Negative for upper GI symptoms  Gastrointestinal (lower) : Constipation  Constitutional : Night Sweats  Skin: Negative for skin symptoms  Eyes: Negative for eye symptoms  Ear/Nose/Throat : Sinus problems  Hematologic/Lymphatic: Easy bruising  Cardiovascular : Negative for cardiovascular symptoms  Respiratory : Negative for respiratory symptoms  Endocrine: Negative for endocrine symptoms  Musculoskeletal: Negative for musculoskeletal symptoms  Neurological: Dizziness  Psychologic: Negative for psychiatric symptoms

## 2020-09-06 NOTE — Patient Instructions (Signed)

## 2020-09-06 NOTE — Addendum Note (Signed)
Addended byGustavus Messing on: 09/06/2020 11:06 AM   Modules accepted: Orders

## 2020-09-08 DIAGNOSIS — M67961 Unspecified disorder of synovium and tendon, right lower leg: Secondary | ICD-10-CM | POA: Diagnosis not present

## 2020-10-05 DIAGNOSIS — M67961 Unspecified disorder of synovium and tendon, right lower leg: Secondary | ICD-10-CM | POA: Diagnosis not present

## 2020-10-20 ENCOUNTER — Ambulatory Visit: Payer: BC Managed Care – PPO | Admitting: Urology

## 2020-11-05 DIAGNOSIS — U071 COVID-19: Secondary | ICD-10-CM | POA: Diagnosis not present

## 2020-11-05 DIAGNOSIS — R059 Cough, unspecified: Secondary | ICD-10-CM | POA: Diagnosis not present

## 2020-11-08 DIAGNOSIS — J069 Acute upper respiratory infection, unspecified: Secondary | ICD-10-CM | POA: Diagnosis not present

## 2020-11-08 DIAGNOSIS — Z20828 Contact with and (suspected) exposure to other viral communicable diseases: Secondary | ICD-10-CM | POA: Diagnosis not present

## 2020-11-22 DIAGNOSIS — R0600 Dyspnea, unspecified: Secondary | ICD-10-CM | POA: Diagnosis not present

## 2020-11-22 DIAGNOSIS — U071 COVID-19: Secondary | ICD-10-CM | POA: Diagnosis not present

## 2020-11-22 DIAGNOSIS — Z6822 Body mass index (BMI) 22.0-22.9, adult: Secondary | ICD-10-CM | POA: Diagnosis not present

## 2020-11-23 DIAGNOSIS — M67961 Unspecified disorder of synovium and tendon, right lower leg: Secondary | ICD-10-CM | POA: Diagnosis not present

## 2021-01-18 DIAGNOSIS — N760 Acute vaginitis: Secondary | ICD-10-CM | POA: Diagnosis not present

## 2021-02-21 DIAGNOSIS — Z01419 Encounter for gynecological examination (general) (routine) without abnormal findings: Secondary | ICD-10-CM | POA: Diagnosis not present

## 2021-02-21 DIAGNOSIS — Z6822 Body mass index (BMI) 22.0-22.9, adult: Secondary | ICD-10-CM | POA: Diagnosis not present

## 2021-02-21 DIAGNOSIS — N760 Acute vaginitis: Secondary | ICD-10-CM | POA: Diagnosis not present

## 2021-02-28 DIAGNOSIS — R3121 Asymptomatic microscopic hematuria: Secondary | ICD-10-CM | POA: Diagnosis not present

## 2021-02-28 DIAGNOSIS — N281 Cyst of kidney, acquired: Secondary | ICD-10-CM | POA: Diagnosis not present

## 2021-03-01 ENCOUNTER — Other Ambulatory Visit: Payer: Self-pay | Admitting: Urology

## 2021-03-01 DIAGNOSIS — R3121 Asymptomatic microscopic hematuria: Secondary | ICD-10-CM

## 2021-03-01 DIAGNOSIS — N281 Cyst of kidney, acquired: Secondary | ICD-10-CM

## 2021-03-02 ENCOUNTER — Other Ambulatory Visit (HOSPITAL_COMMUNITY): Payer: BC Managed Care – PPO

## 2021-03-07 ENCOUNTER — Ambulatory Visit
Admission: RE | Admit: 2021-03-07 | Discharge: 2021-03-07 | Disposition: A | Discharge status: Home / Self Care | Payer: BC Managed Care – PPO | Source: Ambulatory Visit | Attending: Obstetrics & Gynecology | Admitting: Obstetrics & Gynecology

## 2021-03-07 DIAGNOSIS — N6489 Other specified disorders of breast: Secondary | ICD-10-CM

## 2021-03-07 DIAGNOSIS — R922 Inconclusive mammogram: Secondary | ICD-10-CM | POA: Diagnosis not present

## 2021-03-09 ENCOUNTER — Ambulatory Visit: Payer: BC Managed Care – PPO | Admitting: Urology

## 2021-03-09 DIAGNOSIS — Z6823 Body mass index (BMI) 23.0-23.9, adult: Secondary | ICD-10-CM | POA: Diagnosis not present

## 2021-03-09 DIAGNOSIS — J069 Acute upper respiratory infection, unspecified: Secondary | ICD-10-CM | POA: Diagnosis not present

## 2021-03-09 DIAGNOSIS — G43009 Migraine without aura, not intractable, without status migrainosus: Secondary | ICD-10-CM | POA: Diagnosis not present

## 2021-03-14 DIAGNOSIS — Z8719 Personal history of other diseases of the digestive system: Secondary | ICD-10-CM | POA: Diagnosis not present

## 2021-03-14 DIAGNOSIS — K6289 Other specified diseases of anus and rectum: Secondary | ICD-10-CM | POA: Diagnosis not present

## 2021-03-14 DIAGNOSIS — K641 Second degree hemorrhoids: Secondary | ICD-10-CM | POA: Diagnosis not present

## 2021-03-16 ENCOUNTER — Ambulatory Visit (HOSPITAL_COMMUNITY)
Admission: RE | Admit: 2021-03-16 | Discharge: 2021-03-16 | Disposition: A | Discharge status: Home / Self Care | Payer: BC Managed Care – PPO | Source: Ambulatory Visit | Attending: Urology | Admitting: Urology

## 2021-03-16 ENCOUNTER — Other Ambulatory Visit: Payer: Self-pay

## 2021-03-16 DIAGNOSIS — N281 Cyst of kidney, acquired: Secondary | ICD-10-CM

## 2021-03-16 DIAGNOSIS — R3121 Asymptomatic microscopic hematuria: Secondary | ICD-10-CM | POA: Diagnosis not present

## 2021-03-16 MED ORDER — GADOBUTROL 1 MMOL/ML IV SOLN
6.0000 mL | Freq: Once | INTRAVENOUS | Status: AC | PRN
  Administered 2021-03-16: 18:00:00 6 mL via INTRAVENOUS

## 2021-03-16 NOTE — Progress Notes (Signed)
CT staff started PIV site. Consuello Masse

## 2021-03-30 DIAGNOSIS — N281 Cyst of kidney, acquired: Secondary | ICD-10-CM | POA: Diagnosis not present

## 2021-03-30 DIAGNOSIS — R3121 Asymptomatic microscopic hematuria: Secondary | ICD-10-CM | POA: Diagnosis not present

## 2021-04-04 DIAGNOSIS — Z131 Encounter for screening for diabetes mellitus: Secondary | ICD-10-CM | POA: Diagnosis not present

## 2021-04-04 DIAGNOSIS — Z1331 Encounter for screening for depression: Secondary | ICD-10-CM | POA: Diagnosis not present

## 2021-04-04 DIAGNOSIS — N951 Menopausal and female climacteric states: Secondary | ICD-10-CM | POA: Diagnosis not present

## 2021-04-04 DIAGNOSIS — Z0000 Encounter for general adult medical examination without abnormal findings: Secondary | ICD-10-CM | POA: Diagnosis not present

## 2021-04-04 DIAGNOSIS — Z6823 Body mass index (BMI) 23.0-23.9, adult: Secondary | ICD-10-CM | POA: Diagnosis not present

## 2021-04-21 DIAGNOSIS — J329 Chronic sinusitis, unspecified: Secondary | ICD-10-CM | POA: Diagnosis not present

## 2021-04-26 DIAGNOSIS — J329 Chronic sinusitis, unspecified: Secondary | ICD-10-CM | POA: Diagnosis not present

## 2021-05-18 DIAGNOSIS — J323 Chronic sphenoidal sinusitis: Secondary | ICD-10-CM | POA: Diagnosis not present

## 2021-05-30 DIAGNOSIS — J323 Chronic sphenoidal sinusitis: Secondary | ICD-10-CM | POA: Diagnosis not present

## 2021-06-03 NOTE — H&P (Addendum)
HPI:  ? ?Tracy Cline is a 66 y.o. female who presents as a return Patient.  ? ?Current problem: Sinusitis. ? ?HPI: History of 2 sinus operations in the 1990s by Dr. Lyman Bishop. She had polyps. For several months now she has been complaining of left-sided facial pain and pressure. She has had worsening migraines and dizziness as well. Antibiotics have not helped. ? ?PMH/Meds/All/SocHx/FamHx/ROS:  ? ?History reviewed. No pertinent past medical history. ? ?Past Surgical History:  ?Procedure Laterality Date  ? ANKLE SURGERY  ? COLONOSCOPY  ? LASIK  ? REDUCTION MAMMAPLASTY  ? SINUS SURGERY 1991, 1995  ? ?No family history of bleeding disorders, wound healing problems or difficulty with anesthesia.  ? ?Social History  ? ?Socioeconomic History  ? Marital status: Married  ?Spouse name: Not on file  ? Number of children: Not on file  ? Years of education: Not on file  ? Highest education level: Not on file  ?Occupational History  ? Not on file  ?Tobacco Use  ? Smoking status: Former  ?Packs/day: 0.50  ?Types: Cigarettes  ?Quit date: 43  ?Years since quitting: 32.1  ? Smokeless tobacco: Never  ?Substance and Sexual Activity  ? Alcohol use: Yes  ? Drug use: Not on file  ? Sexual activity: Not on file  ?Other Topics Concern  ? Not on file  ?Social History Narrative  ? Not on file  ? ?Social Determinants of Health  ? ?Financial Resource Strain: Not on file  ?Food Insecurity: Not on file  ?Transportation Needs: Not on file  ?Physical Activity: Not on file  ?Stress: Not on file  ?Social Connections: Not on file  ?Housing Stability: Not on file  ? ?Current Outpatient Medications:  ? estradiol-norethindrone (COMBIPATCH) 0.05-0.14 mg/24 hr transdermal patch, CombiPatch 0.05 mg-0.14 mg/24 hr transdermal APPLY 1 PATCH TWICE WEEKLY, Disp: , Rfl:  ? levocetirizine (XYZAL) 5 MG tablet, Xyzal 5 mg tablet, Disp: , Rfl:  ? rizatriptan (MAXALT) 10 MG tablet, Take 1 tablet (10 mg total) by mouth., Disp: , Rfl:  ? triamcinolone (NASACORT) 55  mcg nasal inhaler, Nasacort 55 mcg nasal spray aerosol, Disp: , Rfl:  ? valACYclovir (VALTREX) 1000 MG tablet, valacyclovir 1 gram tablet, Disp: , Rfl:  ? cholecalciferol, vitamin D3, 2,000 unit Tab, Take by mouth., Disp: , Rfl:  ? estradioL (VIVELLE-DOT) 0.05 mg/24 hr, Minivelle 0.05 mg/24 hr transdermal patch, Disp: , Rfl:  ? predniSONE (DELTASONE) 20 MG tablet, PLEASE SEE ATTACHED FOR DETAILED DIRECTIONS, Disp: , Rfl: 0 ? progesterone (PROMETRIUM) 200 MG capsule, TAKE 1 CAPSULE BY MOUTH EVERYDAY AT BEDTIME, Disp: , Rfl: 11 ? vit A,C and E-lutein-minerals 300 mcg-200 mg-27 mg-2 mg Tab, Take by mouth., Disp: , Rfl:  ? ? ?Physical Exam:  ? ?Healthy-appearing lady in no distress. Breathing and voice are clear. Nasal exam is unremarkable. ? ?Independent Review of Additional Tests or Records:  ?Maxillofacial CT: ? ?Procedures:  ?none ? ?Impression & Plans:  ?Chronic left sphenoid sinusitis with associated symptoms, some of which may or may not be related to this. I think the left sided facial pain and pressure is very likely related to the sinusitis. The migraines, dizziness, other symptoms are likely not related and very likely will get better with surgery. I think endoscopic sphenoidotomy is appropriate. Risks and benefits were discussed. This will be a much shorter and easier procedure than the ones that she has had in the past.  ? ? ? ? ? ? ? ? ? ?

## 2021-07-04 ENCOUNTER — Other Ambulatory Visit: Payer: Self-pay

## 2021-07-04 ENCOUNTER — Encounter (HOSPITAL_BASED_OUTPATIENT_CLINIC_OR_DEPARTMENT_OTHER): Payer: Self-pay | Admitting: Otolaryngology

## 2021-07-13 ENCOUNTER — Ambulatory Visit (HOSPITAL_BASED_OUTPATIENT_CLINIC_OR_DEPARTMENT_OTHER): Payer: BC Managed Care – PPO | Admitting: Anesthesiology

## 2021-07-13 ENCOUNTER — Encounter (HOSPITAL_BASED_OUTPATIENT_CLINIC_OR_DEPARTMENT_OTHER): Payer: Self-pay | Admitting: Otolaryngology

## 2021-07-13 ENCOUNTER — Other Ambulatory Visit: Payer: Self-pay

## 2021-07-13 ENCOUNTER — Ambulatory Visit (HOSPITAL_BASED_OUTPATIENT_CLINIC_OR_DEPARTMENT_OTHER)
Admission: RE | Admit: 2021-07-13 | Discharge: 2021-07-13 | Disposition: A | Discharge status: Home / Self Care | Payer: BC Managed Care – PPO | Source: Ambulatory Visit | Attending: Otolaryngology | Admitting: Otolaryngology

## 2021-07-13 ENCOUNTER — Encounter (HOSPITAL_BASED_OUTPATIENT_CLINIC_OR_DEPARTMENT_OTHER)
Admission: RE | Disposition: A | Discharge status: Home / Self Care | Payer: Self-pay | Source: Ambulatory Visit | Attending: Otolaryngology

## 2021-07-13 DIAGNOSIS — J323 Chronic sphenoidal sinusitis: Secondary | ICD-10-CM | POA: Diagnosis not present

## 2021-07-13 DIAGNOSIS — J3489 Other specified disorders of nose and nasal sinuses: Secondary | ICD-10-CM | POA: Diagnosis not present

## 2021-07-13 DIAGNOSIS — Z87891 Personal history of nicotine dependence: Secondary | ICD-10-CM | POA: Diagnosis not present

## 2021-07-13 DIAGNOSIS — J310 Chronic rhinitis: Secondary | ICD-10-CM | POA: Diagnosis not present

## 2021-07-13 HISTORY — PX: NASAL SINUS SURGERY: SHX719

## 2021-07-13 SURGERY — SINUS SURGERY, ENDOSCOPIC
Anesthesia: General | Site: Nose | Laterality: Left

## 2021-07-13 MED ORDER — 0.9 % SODIUM CHLORIDE (POUR BTL) OPTIME
TOPICAL | Status: DC | PRN
  Administered 2021-07-13: 120 mL

## 2021-07-13 MED ORDER — MIDAZOLAM HCL 5 MG/5ML IJ SOLN
INTRAMUSCULAR | Status: DC | PRN
  Administered 2021-07-13: 2 mg via INTRAVENOUS

## 2021-07-13 MED ORDER — LIDOCAINE HCL (CARDIAC) PF 100 MG/5ML IV SOSY
PREFILLED_SYRINGE | INTRAVENOUS | Status: DC | PRN
Start: 2021-07-13 — End: 2021-07-13
  Administered 2021-07-13: 60 mg via INTRAVENOUS

## 2021-07-13 MED ORDER — MEPERIDINE HCL 25 MG/ML IJ SOLN: 6.2500 mg | INTRAMUSCULAR | Status: DC | PRN

## 2021-07-13 MED ORDER — PROPOFOL 500 MG/50ML IV EMUL
INTRAVENOUS | Status: AC
  Filled 2021-07-13: qty 50

## 2021-07-13 MED ORDER — DEXAMETHASONE SODIUM PHOSPHATE 4 MG/ML IJ SOLN
INTRAMUSCULAR | Status: DC | PRN
  Administered 2021-07-13: 10 mg via INTRAVENOUS

## 2021-07-13 MED ORDER — OXYCODONE HCL 5 MG PO TABS
ORAL_TABLET | ORAL | Status: AC
  Filled 2021-07-13: qty 1

## 2021-07-13 MED ORDER — ONDANSETRON HCL 4 MG/2ML IJ SOLN
INTRAMUSCULAR | Status: DC | PRN
  Administered 2021-07-13: 4 mg via INTRAVENOUS

## 2021-07-13 MED ORDER — PROMETHAZINE HCL 25 MG/ML IJ SOLN: 6.2500 mg | INTRAMUSCULAR | Status: DC | PRN

## 2021-07-13 MED ORDER — ONDANSETRON HCL 4 MG/2ML IJ SOLN
INTRAMUSCULAR | Status: AC
  Filled 2021-07-13: qty 2

## 2021-07-13 MED ORDER — SUGAMMADEX SODIUM 200 MG/2ML IV SOLN
INTRAVENOUS | Status: DC | PRN
  Administered 2021-07-13: 250 mg via INTRAVENOUS

## 2021-07-13 MED ORDER — PHENYLEPHRINE HCL (PRESSORS) 10 MG/ML IV SOLN
INTRAVENOUS | Status: DC | PRN
  Administered 2021-07-13 (×2): 40 ug via INTRAVENOUS

## 2021-07-13 MED ORDER — OXYMETAZOLINE HCL 0.05 % NA SOLN
NASAL | Status: AC
  Filled 2021-07-13: qty 30

## 2021-07-13 MED ORDER — LIDOCAINE-EPINEPHRINE 1 %-1:100000 IJ SOLN
INTRAMUSCULAR | Status: AC
  Filled 2021-07-13: qty 2

## 2021-07-13 MED ORDER — MIDAZOLAM HCL 2 MG/2ML IJ SOLN
INTRAMUSCULAR | Status: AC
  Filled 2021-07-13: qty 2

## 2021-07-13 MED ORDER — ROCURONIUM BROMIDE 100 MG/10ML IV SOLN
INTRAVENOUS | Status: DC | PRN
  Administered 2021-07-13: 70 mg via INTRAVENOUS

## 2021-07-13 MED ORDER — LIDOCAINE 2% (20 MG/ML) 5 ML SYRINGE
INTRAMUSCULAR | Status: AC
  Filled 2021-07-13: qty 5

## 2021-07-13 MED ORDER — FENTANYL CITRATE (PF) 100 MCG/2ML IJ SOLN
INTRAMUSCULAR | Status: DC | PRN
Start: 2021-07-13 — End: 2021-07-13
  Administered 2021-07-13 (×2): 50 ug via INTRAVENOUS

## 2021-07-13 MED ORDER — LACTATED RINGERS IV SOLN: INTRAVENOUS | Status: DC

## 2021-07-13 MED ORDER — OXYMETAZOLINE HCL 0.05 % NA SOLN
2.0000 | NASAL | Status: DC
  Administered 2021-07-13: 2 via NASAL

## 2021-07-13 MED ORDER — OXYCODONE HCL 5 MG/5ML PO SOLN: 5.0000 mg | Freq: Once | ORAL | Status: AC | PRN

## 2021-07-13 MED ORDER — HYDROMORPHONE HCL 1 MG/ML IJ SOLN: 0.2500 mg | INTRAMUSCULAR | Status: DC | PRN

## 2021-07-13 MED ORDER — LIDOCAINE-EPINEPHRINE 1 %-1:100000 IJ SOLN
INTRAMUSCULAR | Status: DC | PRN
  Administered 2021-07-13: 3 mL

## 2021-07-13 MED ORDER — ROCURONIUM BROMIDE 10 MG/ML (PF) SYRINGE
PREFILLED_SYRINGE | INTRAVENOUS | Status: AC
  Filled 2021-07-13: qty 10

## 2021-07-13 MED ORDER — OXYCODONE HCL 5 MG PO TABS
5.0000 mg | ORAL_TABLET | Freq: Once | ORAL | Status: AC | PRN
  Administered 2021-07-13: 5 mg via ORAL

## 2021-07-13 MED ORDER — DEXAMETHASONE SODIUM PHOSPHATE 10 MG/ML IJ SOLN
INTRAMUSCULAR | Status: AC
  Filled 2021-07-13: qty 1

## 2021-07-13 MED ORDER — FENTANYL CITRATE (PF) 100 MCG/2ML IJ SOLN
INTRAMUSCULAR | Status: AC
  Filled 2021-07-13: qty 2

## 2021-07-13 MED ORDER — OXYMETAZOLINE HCL 0.05 % NA SOLN
NASAL | Status: AC
  Filled 2021-07-13: qty 60

## 2021-07-13 MED ORDER — PROPOFOL 10 MG/ML IV BOLUS
INTRAVENOUS | Status: DC | PRN
Start: 2021-07-13 — End: 2021-07-13
  Administered 2021-07-13: 150 mg via INTRAVENOUS

## 2021-07-13 MED ORDER — OXYMETAZOLINE HCL 0.05 % NA SOLN
NASAL | Status: DC | PRN
Start: 2021-07-13 — End: 2021-07-13
  Administered 2021-07-13: 1 via TOPICAL

## 2021-07-13 MED ORDER — BACITRACIN ZINC 500 UNIT/GM EX OINT
TOPICAL_OINTMENT | CUTANEOUS | Status: AC
  Filled 2021-07-13: qty 28.35

## 2021-07-13 MED ORDER — EPINEPHRINE PF 1 MG/ML IJ SOLN
INTRAMUSCULAR | Status: AC
  Filled 2021-07-13: qty 2

## 2021-07-13 SURGICAL SUPPLY — 42 items
ATTRACTOMAT 16X20 MAGNETIC DRP (DRAPES) IMPLANT
BLADE RAD40 ROTATE 4M 4 5PK (BLADE) IMPLANT
BLADE RAD60 ROTATE M4 4 5PK (BLADE) IMPLANT
BLADE TRICUT ROTATE M4 4 5PK (BLADE) ×1 IMPLANT
BUR HS RAD FRONTAL 3 (BURR) IMPLANT
CANISTER SUC SOCK COL 7IN (MISCELLANEOUS) ×1 IMPLANT
CANISTER SUCT 1200ML W/VALVE (MISCELLANEOUS) ×3 IMPLANT
CORD BIPOLAR FORCEPS 12FT (ELECTRODE) IMPLANT
DEFOGGER MIRROR 1QT (MISCELLANEOUS) ×2 IMPLANT
DRESSING NASAL KENNEDY 3.5X.9 (MISCELLANEOUS) IMPLANT
DRSG CURAD 3X16 NADH (PACKING) IMPLANT
DRSG NASAL KENNEDY 3.5X.9 (MISCELLANEOUS)
DRSG NASAL KENNEDY LMNT 8CM (GAUZE/BANDAGES/DRESSINGS) IMPLANT
DRSG NASOPORE 8CM (GAUZE/BANDAGES/DRESSINGS) ×1 IMPLANT
DRSG TELFA 3X8 NADH (GAUZE/BANDAGES/DRESSINGS) IMPLANT
FORCEPS BIPOLAR SPETZLER 8 1.0 (NEUROSURGERY SUPPLIES) IMPLANT
GAUZE 4X4 16PLY ~~LOC~~+RFID DBL (SPONGE) IMPLANT
GAUZE VASELINE FOILPK 1/2 X 72 (GAUZE/BANDAGES/DRESSINGS) IMPLANT
GLOVE SURG SYN 7.5  E (GLOVE) ×2
GLOVE SURG SYN 7.5 E (GLOVE) ×1 IMPLANT
GLOVE SURG SYN 7.5 PF PI (GLOVE) ×1 IMPLANT
GOWN STRL REUS W/ TWL LRG LVL3 (GOWN DISPOSABLE) ×2 IMPLANT
GOWN STRL REUS W/TWL LRG LVL3 (GOWN DISPOSABLE) ×4
HEMOSTAT SURGICEL .5X2 ABSORB (HEMOSTASIS) IMPLANT
IV NS 500ML (IV SOLUTION) ×2
IV NS 500ML BAXH (IV SOLUTION) IMPLANT
NDL HYPO 27GX1-1/4 (NEEDLE) ×1 IMPLANT
NDL SPNL 25GX3.5 QUINCKE BL (NEEDLE) IMPLANT
NEEDLE HYPO 27GX1-1/4 (NEEDLE) ×2 IMPLANT
NEEDLE SPNL 25GX3.5 QUINCKE BL (NEEDLE) ×2 IMPLANT
NS IRRIG 1000ML POUR BTL (IV SOLUTION) ×2 IMPLANT
PACK BASIN DAY SURGERY FS (CUSTOM PROCEDURE TRAY) ×2 IMPLANT
PACK ENT DAY SURGERY (CUSTOM PROCEDURE TRAY) ×2 IMPLANT
PAD DRESSING TELFA 3X8 NADH (GAUZE/BANDAGES/DRESSINGS) IMPLANT
PATTIES SURGICAL .5 X3 (DISPOSABLE) ×2 IMPLANT
SLEEVE SCD COMPRESS KNEE MED (STOCKING) ×2 IMPLANT
SPIKE FLUID TRANSFER (MISCELLANEOUS) IMPLANT
SPONGE GAUZE 2X2 8PLY STRL LF (GAUZE/BANDAGES/DRESSINGS) ×2 IMPLANT
SPONGE SURGIFOAM ABS GEL 12-7 (HEMOSTASIS) IMPLANT
TOWEL GREEN STERILE FF (TOWEL DISPOSABLE) ×2 IMPLANT
TUBE CONNECTING 20X1/4 (TUBING) IMPLANT
YANKAUER SUCT BULB TIP NO VENT (SUCTIONS) ×2 IMPLANT

## 2021-07-13 NOTE — Op Note (Signed)
OPERATIVE REPORT ? ?DATE OF SURGERY: 07/13/2021 ? ?PATIENT:  Tracy Cline,  66 y.o. female ? ?PRE-OPERATIVE DIAGNOSIS:  Chronic sinusitis; Rhinitis, chronic ? ?POST-OPERATIVE DIAGNOSIS:  Sphenoid sinusitis ? ?PROCEDURE:  Procedure(s): ?LEFT ENDOSCOPIC SPHENOIDOTOMY ? ?SURGEON:  Susy Frizzle, MD ? ?ASSISTANTS: None ? ?ANESTHESIA:   General  ? ?EBL: 20 ml ? ?DRAINS: None ? ?LOCAL MEDICATIONS USED: 1% Xylocaine with epinephrine ? ?SPECIMEN: Left sphenoid sinus contents ? ?COUNTS:  Correct ? ?PROCEDURE DETAILS: ?The patient was taken to the operating room and placed on the operating table in the supine position. Following induction of general endotracheal anesthesia, the face was prepped and draped in the standard fashion.  Afrin spray was used preoperatively in the nasal cavities.  Using a 0 degree endoscope the left nasal cavity was inspected.  Complete ethmoid cavity was widely patent.  The middle and superior turbinate remnants were identified.  The sphenoid rostrum was identified with some discolored secretions.  A straight suction was used to enter into the sphenoid sinus easily.  The microdebrider was used to enlarge the sphenoid ostium in all directions.  There was soft tissue polypoid change around the ostium.  Contained within the sinus was a large fungus ball which was removed using suction and saline irrigation.  When all of the material was removed and sent for pathologic evaluation.  Topical Afrin pledgets were placed at the space of the sphenoid.  These were removed after extubation.  Patient was then awakened extubated and transferred to recovery in stable condition. ? ? ? ?PATIENT DISPOSITION:  To PACU, stable ? ? ? ?

## 2021-07-13 NOTE — H&P (Signed)
?  HPI:  ? ?Tracy Cline is a 66 y.o. female who presents as a return Patient.  ? ?Current problem: Sinusitis. ? ?HPI: History of 2 sinus operations in the 1990s by Dr. Lyman Bishop. She had polyps. For several months now she has been complaining of left-sided facial pain and pressure. She has had worsening migraines and dizziness as well. Antibiotics have not helped. ? ?PMH/Meds/All/SocHx/FamHx/ROS:  ? ?History reviewed. No pertinent past medical history. ? ?Past Surgical History:  ?Procedure Laterality Date  ? ANKLE SURGERY  ? COLONOSCOPY  ? LASIK  ? REDUCTION MAMMAPLASTY  ? SINUS SURGERY 1991, 1995  ? ?No family history of bleeding disorders, wound healing problems or difficulty with anesthesia.  ? ?Social History  ? ?Socioeconomic History  ? Marital status: Married  ?Spouse name: Not on file  ? Number of children: Not on file  ? Years of education: Not on file  ? Highest education level: Not on file  ?Occupational History  ? Not on file  ?Tobacco Use  ? Smoking status: Former  ?Packs/day: 0.50  ?Types: Cigarettes  ?Quit date: 48  ?Years since quitting: 32.1  ? Smokeless tobacco: Never  ?Substance and Sexual Activity  ? Alcohol use: Yes  ? Drug use: Not on file  ? Sexual activity: Not on file  ?Other Topics Concern  ? Not on file  ?Social History Narrative  ? Not on file  ? ?Social Determinants of Health  ? ?Financial Resource Strain: Not on file  ?Food Insecurity: Not on file  ?Transportation Needs: Not on file  ?Physical Activity: Not on file  ?Stress: Not on file  ?Social Connections: Not on file  ?Housing Stability: Not on file  ? ?Current Outpatient Medications:  ? estradiol-norethindrone (COMBIPATCH) 0.05-0.14 mg/24 hr transdermal patch, CombiPatch 0.05 mg-0.14 mg/24 hr transdermal APPLY 1 PATCH TWICE WEEKLY, Disp: , Rfl:  ? levocetirizine (XYZAL) 5 MG tablet, Xyzal 5 mg tablet, Disp: , Rfl:  ? rizatriptan (MAXALT) 10 MG tablet, Take 1 tablet (10 mg total) by mouth., Disp: , Rfl:  ? triamcinolone (NASACORT)  55 mcg nasal inhaler, Nasacort 55 mcg nasal spray aerosol, Disp: , Rfl:  ? valACYclovir (VALTREX) 1000 MG tablet, valacyclovir 1 gram tablet, Disp: , Rfl:  ? cholecalciferol, vitamin D3, 2,000 unit Tab, Take by mouth., Disp: , Rfl:  ? estradioL (VIVELLE-DOT) 0.05 mg/24 hr, Minivelle 0.05 mg/24 hr transdermal patch, Disp: , Rfl:  ? predniSONE (DELTASONE) 20 MG tablet, PLEASE SEE ATTACHED FOR DETAILED DIRECTIONS, Disp: , Rfl: 0 ? progesterone (PROMETRIUM) 200 MG capsule, TAKE 1 CAPSULE BY MOUTH EVERYDAY AT BEDTIME, Disp: , Rfl: 11 ? vit A,C and E-lutein-minerals 300 mcg-200 mg-27 mg-2 mg Tab, Take by mouth., Disp: , Rfl:  ? ? ?Physical Exam:  ? ?Healthy-appearing lady in no distress. Breathing and voice are clear. Nasal exam is unremarkable. ? ?Independent Review of Additional Tests or Records:  ?Maxillofacial CT: ? ?Procedures:  ?none ? ?Impression & Plans:  ?Chronic left sphenoid sinusitis with associated symptoms, some of which may or may not be related to this. I think the left sided facial pain and pressure is very likely related to the sinusitis. The migraines, dizziness, other symptoms are likely not related and very likely will get better with surgery. I think endoscopic sphenoidotomy is appropriate. Risks and benefits were discussed. This will be a much shorter and easier procedure than the ones that she has had in the past.  ?  ?  ?  ?  ?  ? ?

## 2021-07-13 NOTE — Interval H&P Note (Signed)
History and Physical Interval Note: ? ?07/13/2021 ?8:46 AM ? ?Tracy Cline  has presented today for surgery, with the diagnosis of Chronic sinusitis; Rhinitis, chronic.  The various methods of treatment have been discussed with the patient and family. After consideration of risks, benefits and other options for treatment, the patient has consented to  Procedure(s): ?ENDOSCOPIC SPHENOIDOTOMY (Left) as a surgical intervention.  The patient's history has been reviewed, patient examined, no change in status, stable for surgery.  I have reviewed the patient's chart and labs.  Questions were answered to the patient's satisfaction.   ? ? ?[name] ? ? ?

## 2021-07-13 NOTE — Anesthesia Procedure Notes (Signed)
Procedure Name: Intubation ?Date/Time: 07/13/2021 9:14 AM ?Performed by: Maryella Shivers, CRNA ?Pre-anesthesia Checklist: Patient identified, Emergency Drugs available, Suction available and Patient being monitored ?Patient Re-evaluated:Patient Re-evaluated prior to induction ?Oxygen Delivery Method: Circle system utilized ?Preoxygenation: Pre-oxygenation with 100% oxygen ?Induction Type: IV induction ?Ventilation: Mask ventilation without difficulty ?Laryngoscope Size: Mac and 3 ?Grade View: Grade I ?Tube type: Oral ?Tube size: 7.0 mm ?Number of attempts: 1 ?Airway Equipment and Method: Stylet and Oral airway ?Placement Confirmation: ETT inserted through vocal cords under direct vision, positive ETCO2 and breath sounds checked- equal and bilateral ?Secured at: 20 cm ?Tube secured with: Tape ?Dental Injury: Teeth and Oropharynx as per pre-operative assessment  ? ? ? ? ?

## 2021-07-13 NOTE — Anesthesia Preprocedure Evaluation (Signed)
Anesthesia Evaluation  ?Patient identified by MRN, date of birth, ID band ?Patient awake ? ? ? ?Reviewed: ?Allergy & Precautions, NPO status , Patient's Chart, lab work & pertinent test results ? ?Airway ?Mallampati: II ? ?TM Distance: >3 FB ?Neck ROM: Full ? ? ? Dental ?no notable dental hx. ? ?  ?Pulmonary ?neg pulmonary ROS, former smoker,  ?  ?Pulmonary exam normal ?breath sounds clear to auscultation ? ? ? ? ? ? Cardiovascular ?negative cardio ROS ?Normal cardiovascular exam ?Rhythm:Regular Rate:Normal ? ? ?  ?Neuro/Psych ?negative neurological ROS ? negative psych ROS  ? GI/Hepatic ?Neg liver ROS, GERD  ,  ?Endo/Other  ?negative endocrine ROS ? Renal/GU ?  ?negative genitourinary ?  ?Musculoskeletal ?negative musculoskeletal ROS ?(+)  ? Abdominal ?  ?Peds ?negative pediatric ROS ?(+)  Hematology ?negative hematology ROS ?(+)   ?Anesthesia Other Findings ? ? Reproductive/Obstetrics ?negative OB ROS ? ?  ? ? ? ? ? ? ? ? ? ? ? ? ? ?  ?  ? ? ? ? ? ? ? ? ?Anesthesia Physical ?Anesthesia Plan ? ?ASA: 2 ? ?Anesthesia Plan: General  ? ?Post-op Pain Management:   ? ?Induction: Intravenous ? ?PONV Risk Score and Plan: 3 and Ondansetron, Dexamethasone, Midazolam and Treatment may vary due to age or medical condition ? ?Airway Management Planned: Oral ETT ? ?Additional Equipment:  ? ?Intra-op Plan:  ? ?Post-operative Plan: Extubation in OR ? ?Informed Consent: I have reviewed the patients History and Physical, chart, labs and discussed the procedure including the risks, benefits and alternatives for the proposed anesthesia with the patient or authorized representative who has indicated his/her understanding and acceptance.  ? ? ? ?Dental advisory given ? ?Plan Discussed with: CRNA ? ?Anesthesia Plan Comments:   ? ? ? ? ? ? ?Anesthesia Quick Evaluation ? ?

## 2021-07-13 NOTE — Transfer of Care (Signed)
Immediate Anesthesia Transfer of Care Note ? ?Patient: Tracy Cline ? ?Procedure(s) Performed: LEFT ENDOSCOPIC SPHENOIDOTOMY (Left: Nose) ? ?Patient Location: PACU ? ?Anesthesia Type:General ? ?Level of Consciousness: awake, alert  and oriented ? ?Airway & Oxygen Therapy: Patient Spontanous Breathing and Patient connected to face mask oxygen ? ?Post-op Assessment: Report given to RN and Post -op Vital signs reviewed and stable ? ?Post vital signs: Reviewed and stable ? ?Last Vitals:  ?Vitals Value Taken Time  ?BP    ?Temp    ?Pulse    ?Resp    ?SpO2    ? ? ?Last Pain:  ?Vitals:  ? 07/13/21 0738  ?TempSrc: Oral  ?PainSc: 0-No pain  ?   ? ?Patients Stated Pain Goal: 3 (07/13/21 3419) ? ?Complications: No notable events documented. ?

## 2021-07-13 NOTE — Discharge Instructions (Addendum)
Use nasal saline spray 20-30 times daily and irrigate twice daily. ? ?Use tylenol/motrin as needed for pain. ? ? ? ?Post Anesthesia Home Care Instructions ? ?Activity: ?Get plenty of rest for the remainder of the day. A responsible individual must stay with you for 24 hours following the procedure.  ?For the next 24 hours, DO NOT: ?-Drive a car ?-Advertising copywriter ?-Drink alcoholic beverages ?-Take any medication unless instructed by your physician ?-Make any legal decisions or sign important papers. ? ?Meals: ?Start with liquid foods such as gelatin or soup. Progress to regular foods as tolerated. Avoid greasy, spicy, heavy foods. If nausea and/or vomiting occur, drink only clear liquids until the nausea and/or vomiting subsides. Call your physician if vomiting continues. ? ?Special Instructions/Symptoms: ?Your throat may feel dry or sore from the anesthesia or the breathing tube placed in your throat during surgery. If this causes discomfort, gargle with warm salt water. The discomfort should disappear within 24 hours. ? ?If you had a scopolamine patch placed behind your ear for the management of post- operative nausea and/or vomiting: ? ?1. The medication in the patch is effective for 72 hours, after which it should be removed.  Wrap patch in a tissue and discard in the trash. Wash hands thoroughly with soap and water. ?2. You may remove the patch earlier than 72 hours if you experience unpleasant side effects which may include dry mouth, dizziness or visual disturbances. ?3. Avoid touching the patch. Wash your hands with soap and water after contact with the patch. ?    ?

## 2021-07-13 NOTE — Anesthesia Postprocedure Evaluation (Signed)
Anesthesia Post Note ? ?Patient: Tracy Cline ? ?Procedure(s) Performed: LEFT ENDOSCOPIC SPHENOIDOTOMY (Left: Nose) ? ?  ? ?Patient location during evaluation: PACU ?Anesthesia Type: General ?Level of consciousness: awake and alert ?Pain management: pain level controlled ?Vital Signs Assessment: post-procedure vital signs reviewed and stable ?Respiratory status: spontaneous breathing, nonlabored ventilation and respiratory function stable ?Cardiovascular status: blood pressure returned to baseline and stable ?Postop Assessment: no apparent nausea or vomiting ?Anesthetic complications: no ? ? ?No notable events documented. ? ?Last Vitals:  ?Vitals:  ? 07/13/21 1030 07/13/21 1100  ?BP: 137/80 (!) 154/73  ?Pulse: 73 75  ?Resp: 15 20  ?Temp:  36.6 ?C  ?SpO2: 99% 98%  ?  ?Last Pain:  ?Vitals:  ? 07/13/21 1100  ?TempSrc: Oral  ?PainSc: 4   ? ? ?  ?  ?  ?  ?  ?  ? ?Lowella Curb ? ? ? ? ?

## 2021-07-14 ENCOUNTER — Encounter (HOSPITAL_BASED_OUTPATIENT_CLINIC_OR_DEPARTMENT_OTHER): Payer: Self-pay | Admitting: Otolaryngology

## 2021-07-15 LAB — SURGICAL PATHOLOGY

## 2021-12-16 ENCOUNTER — Other Ambulatory Visit: Payer: Self-pay | Admitting: Obstetrics & Gynecology

## 2021-12-16 DIAGNOSIS — N6489 Other specified disorders of breast: Secondary | ICD-10-CM

## 2021-12-16 DIAGNOSIS — R928 Other abnormal and inconclusive findings on diagnostic imaging of breast: Secondary | ICD-10-CM

## 2022-03-08 ENCOUNTER — Ambulatory Visit
Admission: RE | Admit: 2022-03-08 | Discharge: 2022-03-08 | Disposition: A | Discharge status: Home / Self Care | Payer: No Typology Code available for payment source | Source: Ambulatory Visit | Attending: Obstetrics & Gynecology | Admitting: Obstetrics & Gynecology

## 2022-03-08 DIAGNOSIS — N6489 Other specified disorders of breast: Secondary | ICD-10-CM

## 2022-03-08 DIAGNOSIS — R928 Other abnormal and inconclusive findings on diagnostic imaging of breast: Secondary | ICD-10-CM

## 2022-05-31 DIAGNOSIS — Z6824 Body mass index (BMI) 24.0-24.9, adult: Secondary | ICD-10-CM | POA: Diagnosis not present

## 2022-05-31 DIAGNOSIS — R21 Rash and other nonspecific skin eruption: Secondary | ICD-10-CM | POA: Diagnosis not present

## 2022-05-31 DIAGNOSIS — I951 Orthostatic hypotension: Secondary | ICD-10-CM | POA: Diagnosis not present

## 2022-07-04 DIAGNOSIS — H16202 Unspecified keratoconjunctivitis, left eye: Secondary | ICD-10-CM | POA: Diagnosis not present

## 2022-07-17 DIAGNOSIS — H18453 Nodular corneal degeneration, bilateral: Secondary | ICD-10-CM | POA: Diagnosis not present

## 2022-07-22 IMAGING — US US BREAST*R* LIMITED INC AXILLA
1 series · 3 of 3 positions shown · non-contrast
Comparison: Previous exams including recent screening mammogram
dated 02/12/2020

CLINICAL DATA: Patient returns today to evaluate possible bilateral
breast asymmetries questioned on recent screening mammogram.

EXAM:
DIGITAL DIAGNOSTIC BILATERAL MAMMOGRAM WITH CAD AND TOMO
ULTRASOUND BILATERAL BREAST

[Series 1: us breast*right* limited inc axilla · 0.07mm/px · 3 of 3 slices shown]
[im 1/3]
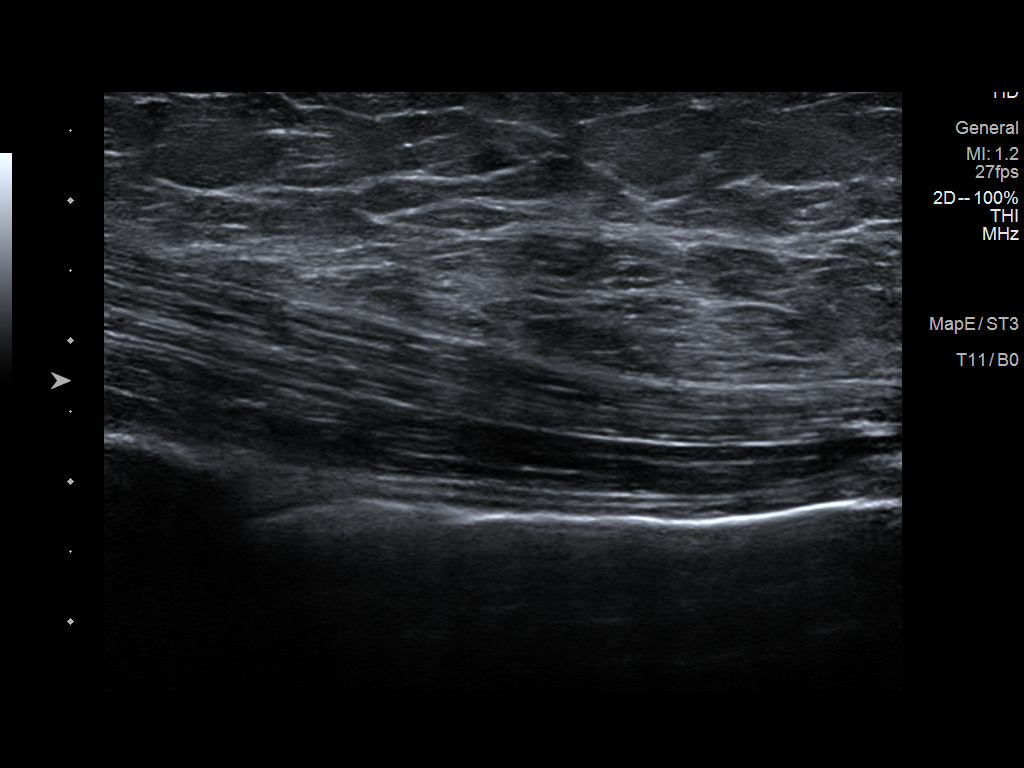
[im 2/3]
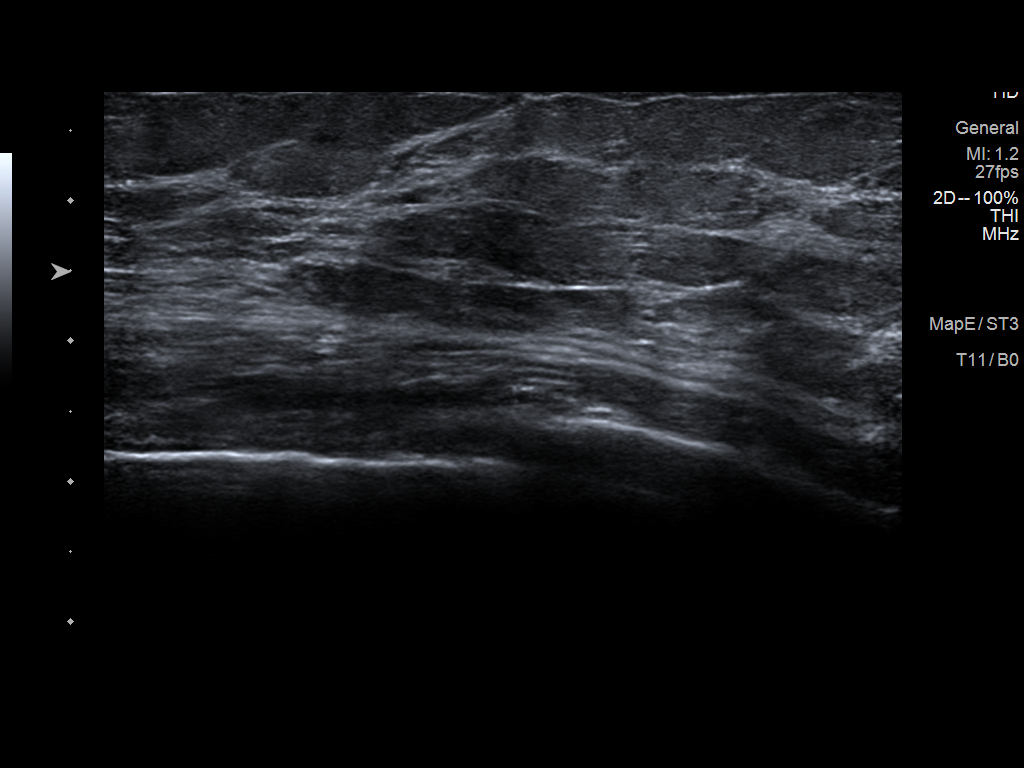
[im 3/3]
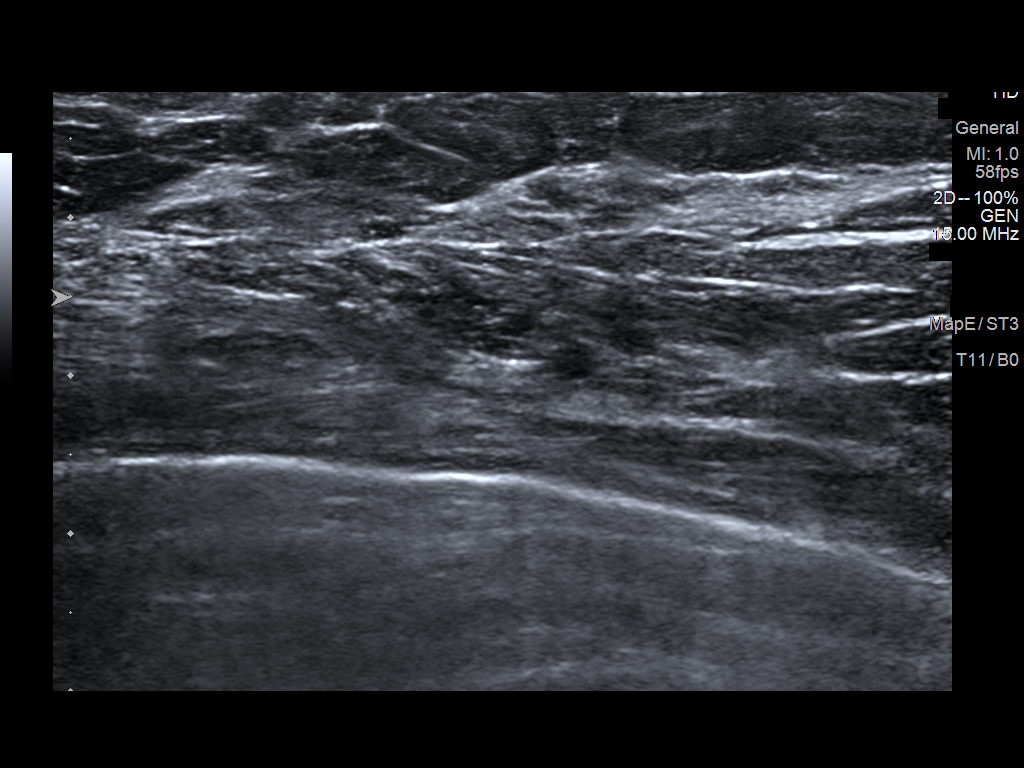

[3 of 3 positions shown; findings below may reference images not displayed]

ACR Breast Density Category b: There are scattered areas of
fibroglandular density.
FINDINGS: RIGHT breast: On today's additional diagnostic views, including spot
compression with 3D tomosynthesis, the questioned asymmetry within
the upper-outer quadrant of the RIGHT breast is less conspicuous,
suspected superimposition of normal fibroglandular tissues and/or
benign intramammary lymph node. Ultrasound will be performed for
further characterization.

LEFT breast: On today's additional diagnostic views, including spot
compression with 3D tomosynthesis, the questioned asymmetry within
the upper-outer quadrant of the LEFT breast is less conspicuous,
suspected superimposition of normal fibroglandular tissues and/or
benign intramammary lymph node. Ultrasound will be performed for
further characterization.

Mammographic images were processed with CAD.

RIGHT breast: Targeted ultrasound is performed, evaluating the
upper-outer quadrant, showing only normal fibroglandular tissues and
fat lobules throughout. No solid or cystic mass. No intramammary
lymph node.

LEFT breast: Targeted ultrasound is performed, evaluating the
upper-outer quadrant, showing only normal fibroglandular tissues and
fat lobules throughout. No solid or cystic mass. No intramammary
lymph node.
IMPRESSION: Probably benign superimposition of normal fibroglandular tissues
and/or sonographically occult intramammary lymph nodes within the
upper-outer quadrants of each breast. Recommend follow-up bilateral
diagnostic mammogram in 6 months to ensure stability.

RECOMMENDATION:
Bilateral diagnostic mammogram, and possible bilateral ultrasound,
in 6 months.

I have discussed the findings and recommendations with the patient.
If applicable, a reminder letter will be sent to the patient
regarding the next appointment.

BI-RADS CATEGORY  3: Probably benign.

## 2022-07-22 IMAGING — MG DIGITAL DIAGNOSTIC BILAT W/ TOMO W/ CAD
8 series · 8 of 24 positions shown · non-contrast
Comparison: Previous exams including recent screening mammogram
dated 02/12/2020

CLINICAL DATA: Patient returns today to evaluate possible bilateral
breast asymmetries questioned on recent screening mammogram.

EXAM:
DIGITAL DIAGNOSTIC BILATERAL MAMMOGRAM WITH CAD AND TOMO
ULTRASOUND BILATERAL BREAST

[R ML synth-2D]
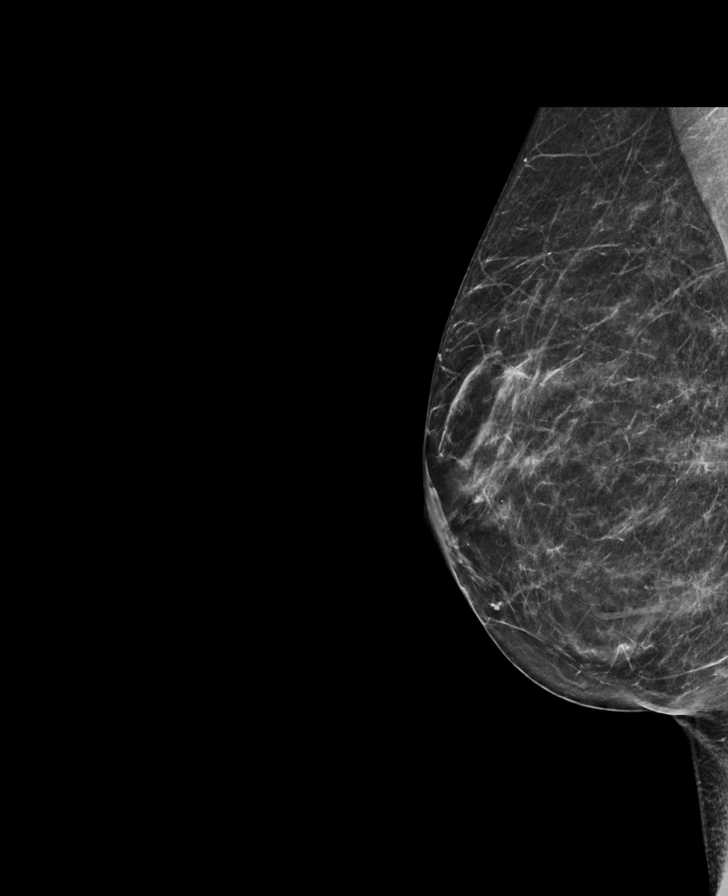

[R MLO synth-2D]
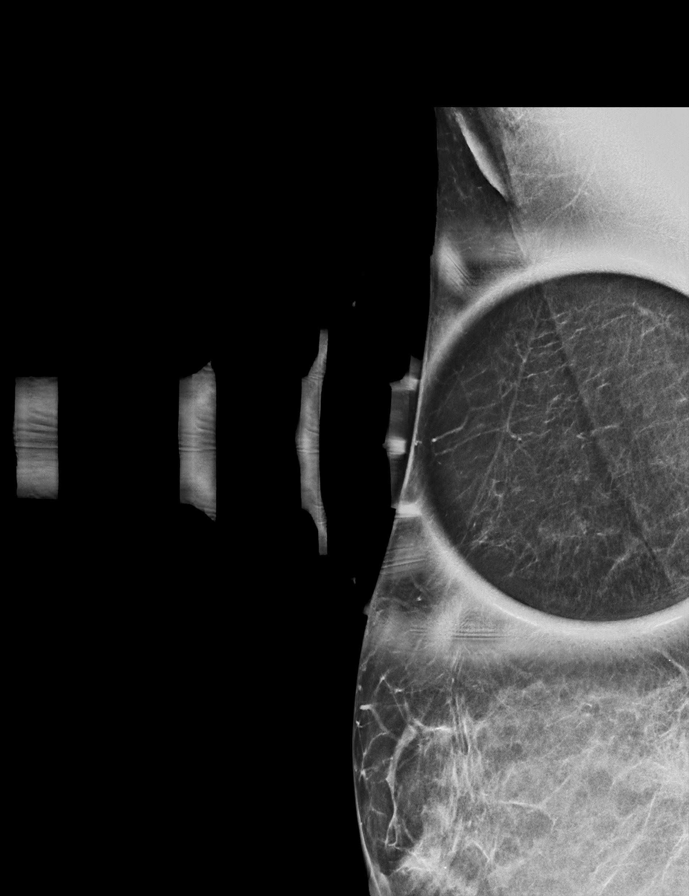

[L ML synth-2D]
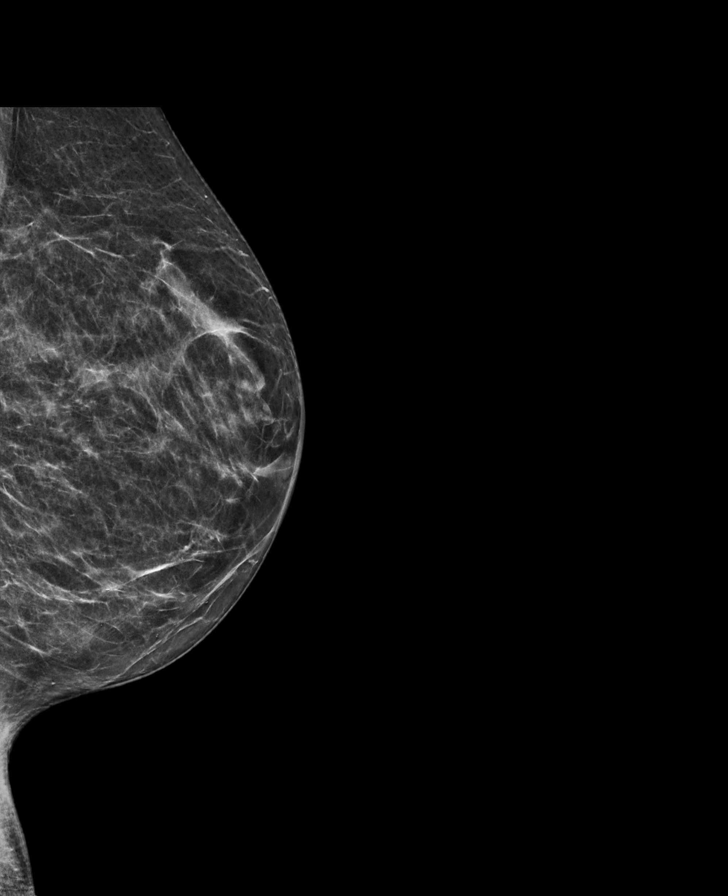

[L MLO synth-2D]
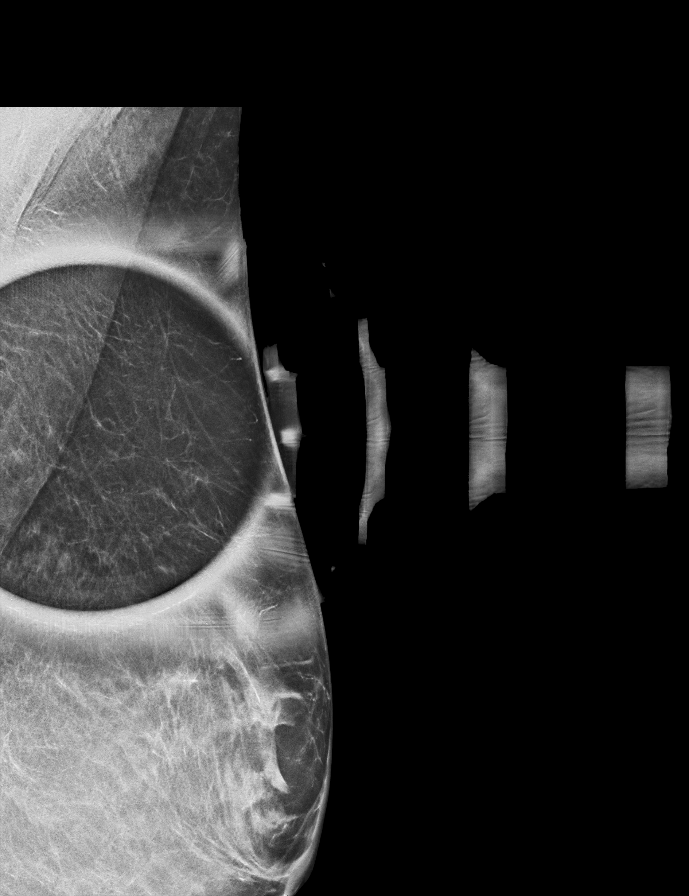

[L MLO tomo · tomo slice 25/50.0]
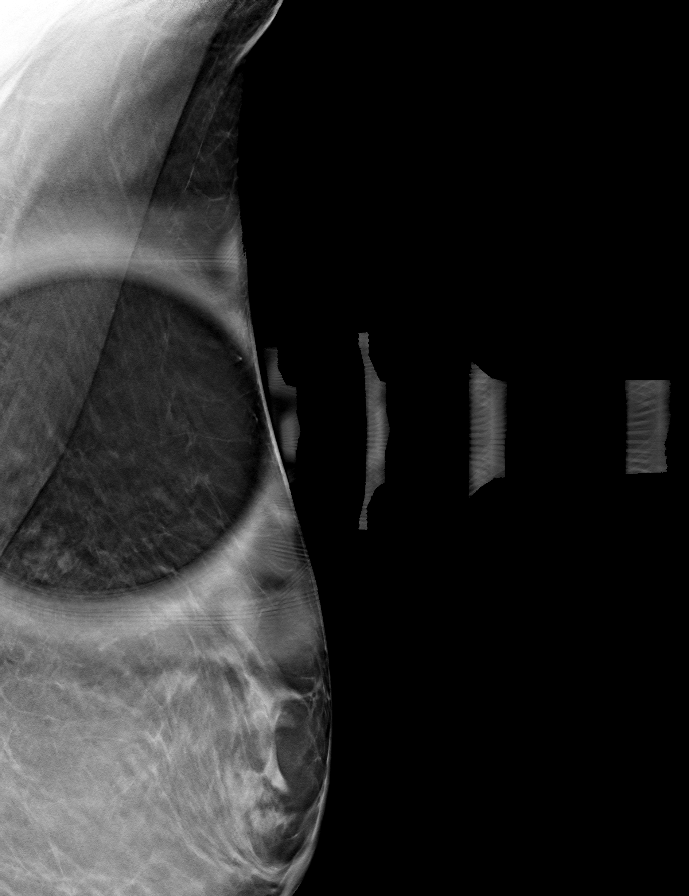

[L ML tomo · tomo slice 31/62.0]
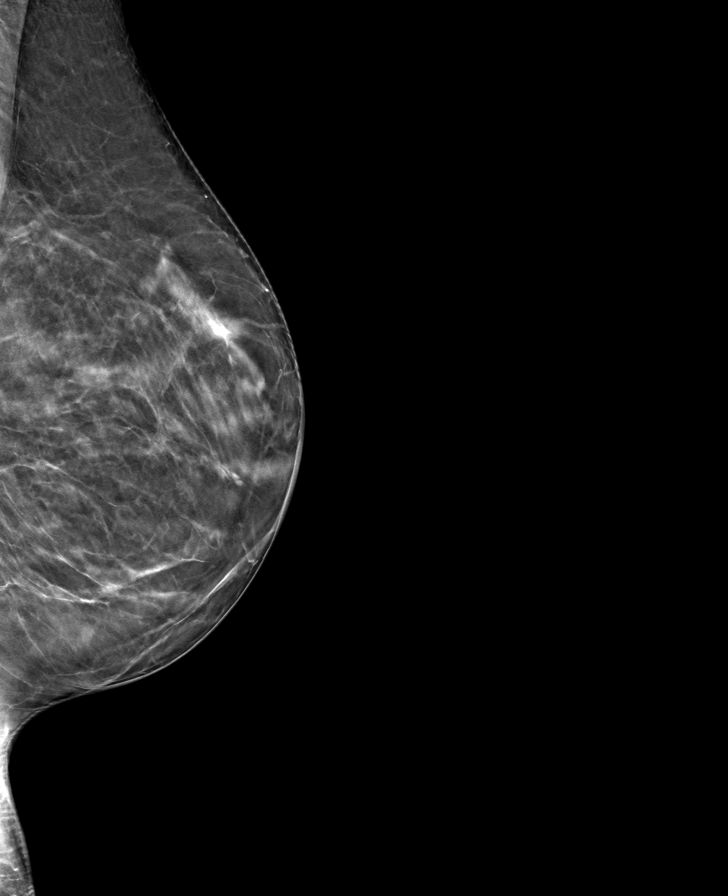

[R MLO tomo · tomo slice 27/54.0]
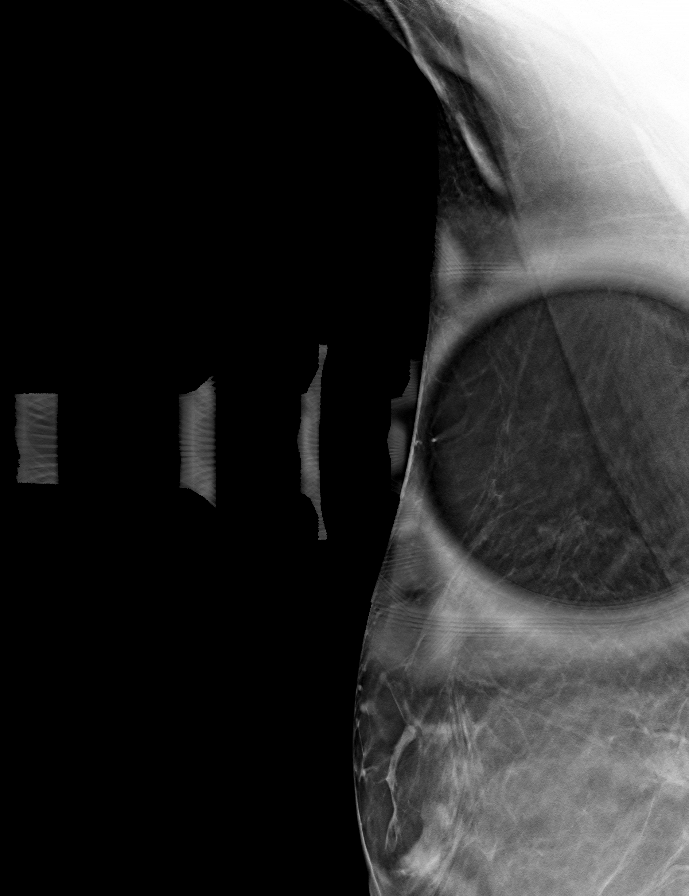

[R ML tomo · tomo slice 31/62.0]
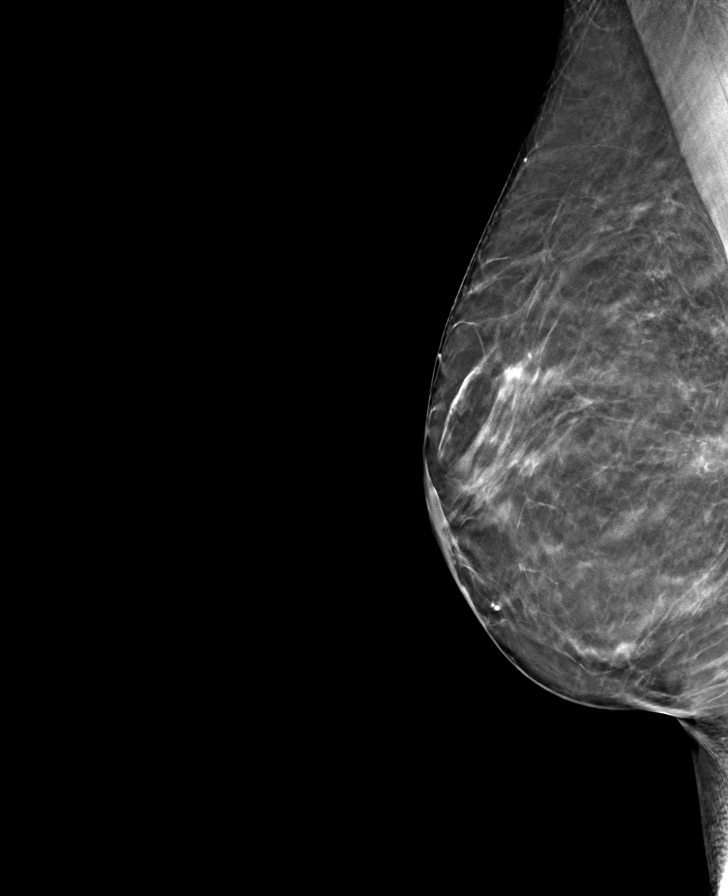

[8 of 24 positions shown; findings below may reference images not displayed]

ACR Breast Density Category b: There are scattered areas of
fibroglandular density.
FINDINGS: RIGHT breast: On today's additional diagnostic views, including spot
compression with 3D tomosynthesis, the questioned asymmetry within
the upper-outer quadrant of the RIGHT breast is less conspicuous,
suspected superimposition of normal fibroglandular tissues and/or
benign intramammary lymph node. Ultrasound will be performed for
further characterization.

LEFT breast: On today's additional diagnostic views, including spot
compression with 3D tomosynthesis, the questioned asymmetry within
the upper-outer quadrant of the LEFT breast is less conspicuous,
suspected superimposition of normal fibroglandular tissues and/or
benign intramammary lymph node. Ultrasound will be performed for
further characterization.

Mammographic images were processed with CAD.

RIGHT breast: Targeted ultrasound is performed, evaluating the
upper-outer quadrant, showing only normal fibroglandular tissues and
fat lobules throughout. No solid or cystic mass. No intramammary
lymph node.

LEFT breast: Targeted ultrasound is performed, evaluating the
upper-outer quadrant, showing only normal fibroglandular tissues and
fat lobules throughout. No solid or cystic mass. No intramammary
lymph node.
IMPRESSION: Probably benign superimposition of normal fibroglandular tissues
and/or sonographically occult intramammary lymph nodes within the
upper-outer quadrants of each breast. Recommend follow-up bilateral
diagnostic mammogram in 6 months to ensure stability.

RECOMMENDATION:
Bilateral diagnostic mammogram, and possible bilateral ultrasound,
in 6 months.

I have discussed the findings and recommendations with the patient.
If applicable, a reminder letter will be sent to the patient
regarding the next appointment.

BI-RADS CATEGORY  3: Probably benign.

## 2022-10-16 DIAGNOSIS — H18453 Nodular corneal degeneration, bilateral: Secondary | ICD-10-CM | POA: Diagnosis not present

## 2022-10-20 DIAGNOSIS — N281 Cyst of kidney, acquired: Secondary | ICD-10-CM | POA: Diagnosis not present

## 2022-10-20 DIAGNOSIS — R3121 Asymptomatic microscopic hematuria: Secondary | ICD-10-CM | POA: Diagnosis not present

## 2022-11-21 ENCOUNTER — Other Ambulatory Visit: Payer: Self-pay | Admitting: Obstetrics & Gynecology

## 2022-11-21 DIAGNOSIS — Z1231 Encounter for screening mammogram for malignant neoplasm of breast: Secondary | ICD-10-CM

## 2022-11-22 DIAGNOSIS — M65311 Trigger thumb, right thumb: Secondary | ICD-10-CM | POA: Diagnosis not present

## 2022-12-31 DIAGNOSIS — S61211A Laceration without foreign body of left index finger without damage to nail, initial encounter: Secondary | ICD-10-CM | POA: Diagnosis not present

## 2022-12-31 DIAGNOSIS — S61215A Laceration without foreign body of left ring finger without damage to nail, initial encounter: Secondary | ICD-10-CM | POA: Diagnosis not present

## 2023-01-01 DIAGNOSIS — M62838 Other muscle spasm: Secondary | ICD-10-CM | POA: Diagnosis not present

## 2023-01-01 DIAGNOSIS — M5030 Other cervical disc degeneration, unspecified cervical region: Secondary | ICD-10-CM | POA: Diagnosis not present

## 2023-01-01 DIAGNOSIS — M542 Cervicalgia: Secondary | ICD-10-CM | POA: Diagnosis not present

## 2023-01-30 DIAGNOSIS — M542 Cervicalgia: Secondary | ICD-10-CM | POA: Diagnosis not present

## 2023-01-30 DIAGNOSIS — M62838 Other muscle spasm: Secondary | ICD-10-CM | POA: Diagnosis not present

## 2023-01-30 DIAGNOSIS — M5030 Other cervical disc degeneration, unspecified cervical region: Secondary | ICD-10-CM | POA: Diagnosis not present

## 2023-02-14 LAB — EXTERNAL GENERIC LAB PROCEDURE: COLOGUARD: NEGATIVE

## 2023-02-14 LAB — COLOGUARD: COLOGUARD: NEGATIVE

## 2023-02-28 DIAGNOSIS — M65311 Trigger thumb, right thumb: Secondary | ICD-10-CM | POA: Diagnosis not present

## 2023-03-12 ENCOUNTER — Ambulatory Visit
Admission: RE | Admit: 2023-03-12 | Discharge: 2023-03-12 | Disposition: A | Discharge status: Home / Self Care | Payer: 59 | Source: Ambulatory Visit | Attending: Obstetrics & Gynecology | Admitting: Obstetrics & Gynecology

## 2023-03-12 DIAGNOSIS — Z1231 Encounter for screening mammogram for malignant neoplasm of breast: Secondary | ICD-10-CM

## 2023-03-22 DIAGNOSIS — M65311 Trigger thumb, right thumb: Secondary | ICD-10-CM | POA: Diagnosis not present

## 2023-04-04 DIAGNOSIS — M65311 Trigger thumb, right thumb: Secondary | ICD-10-CM | POA: Diagnosis not present

## 2024-02-18 ENCOUNTER — Other Ambulatory Visit: Payer: Self-pay | Admitting: Family Medicine

## 2024-02-18 DIAGNOSIS — Z1231 Encounter for screening mammogram for malignant neoplasm of breast: Secondary | ICD-10-CM

## 2024-03-24 ENCOUNTER — Ambulatory Visit
Admission: RE | Admit: 2024-03-24 | Discharge: 2024-03-24 | Disposition: A | Source: Ambulatory Visit | Attending: Family Medicine | Admitting: Family Medicine

## 2024-03-24 DIAGNOSIS — Z1231 Encounter for screening mammogram for malignant neoplasm of breast: Secondary | ICD-10-CM
